# Patient Record
Sex: Female | Born: 1945 | Race: White | Hispanic: No | Smoking: Former smoker
Health system: Southern US, Community
[De-identification: ages and names within clinical notes are randomized; demographics above are authoritative.]

## PROBLEM LIST (undated history)

## (undated) DIAGNOSIS — K5730 Diverticulosis of large intestine without perforation or abscess without bleeding: Secondary | ICD-10-CM

## (undated) DIAGNOSIS — Z780 Asymptomatic menopausal state: Secondary | ICD-10-CM

## (undated) DIAGNOSIS — M545 Low back pain, unspecified: Secondary | ICD-10-CM

## (undated) DIAGNOSIS — E785 Hyperlipidemia, unspecified: Secondary | ICD-10-CM

## (undated) DIAGNOSIS — E039 Hypothyroidism, unspecified: Secondary | ICD-10-CM

## (undated) DIAGNOSIS — F17200 Nicotine dependence, unspecified, uncomplicated: Secondary | ICD-10-CM

## (undated) DIAGNOSIS — K621 Rectal polyp: Secondary | ICD-10-CM

## (undated) DIAGNOSIS — R4701 Aphasia: Secondary | ICD-10-CM

## (undated) DIAGNOSIS — G309 Alzheimer's disease, unspecified: Secondary | ICD-10-CM

## (undated) DIAGNOSIS — M1990 Unspecified osteoarthritis, unspecified site: Secondary | ICD-10-CM

## (undated) DIAGNOSIS — K648 Other hemorrhoids: Secondary | ICD-10-CM

## (undated) DIAGNOSIS — F0280 Dementia in other diseases classified elsewhere without behavioral disturbance: Secondary | ICD-10-CM

## (undated) DIAGNOSIS — G8929 Other chronic pain: Secondary | ICD-10-CM

## (undated) HISTORY — PX: TONSILLECTOMY AND ADENOIDECTOMY: SUR1326

## (undated) HISTORY — DX: Nicotine dependence, unspecified, uncomplicated: F17.200

## (undated) HISTORY — PX: APPENDECTOMY: SHX54

## (undated) HISTORY — DX: Diverticulosis of large intestine without perforation or abscess without bleeding: K57.30

## (undated) HISTORY — DX: Rectal polyp: K62.1

## (undated) HISTORY — DX: Hypothyroidism, unspecified: E03.9

## (undated) HISTORY — DX: Hyperlipidemia, unspecified: E78.5

## (undated) HISTORY — DX: Asymptomatic menopausal state: Z78.0

## (undated) HISTORY — DX: Other hemorrhoids: K64.8

---

## 1989-07-09 HISTORY — PX: MOUTH SURGERY: SHX715

## 1998-10-21 ENCOUNTER — Other Ambulatory Visit: Admission: RE | Admit: 1998-10-21 | Discharge: 1998-10-21 | Payer: Self-pay | Admitting: Obstetrics and Gynecology

## 1998-11-08 DIAGNOSIS — Z780 Asymptomatic menopausal state: Secondary | ICD-10-CM

## 1998-11-08 HISTORY — DX: Asymptomatic menopausal state: Z78.0

## 1999-10-22 ENCOUNTER — Other Ambulatory Visit: Admission: RE | Admit: 1999-10-22 | Discharge: 1999-10-22 | Payer: Self-pay | Admitting: Obstetrics and Gynecology

## 2000-05-17 ENCOUNTER — Encounter: Payer: Self-pay | Admitting: Family Medicine

## 2000-05-17 ENCOUNTER — Encounter: Admission: RE | Admit: 2000-05-17 | Discharge: 2000-05-17 | Payer: Self-pay | Admitting: Family Medicine

## 2000-12-14 ENCOUNTER — Encounter (INDEPENDENT_AMBULATORY_CARE_PROVIDER_SITE_OTHER): Payer: Self-pay | Admitting: Specialist

## 2000-12-14 ENCOUNTER — Other Ambulatory Visit: Admission: RE | Admit: 2000-12-14 | Discharge: 2000-12-14 | Payer: Self-pay | Admitting: Radiology

## 2000-12-15 ENCOUNTER — Other Ambulatory Visit: Admission: RE | Admit: 2000-12-15 | Discharge: 2000-12-15 | Payer: Self-pay | Admitting: Obstetrics and Gynecology

## 2001-03-03 ENCOUNTER — Encounter (INDEPENDENT_AMBULATORY_CARE_PROVIDER_SITE_OTHER): Payer: Self-pay

## 2001-03-03 ENCOUNTER — Other Ambulatory Visit: Admission: RE | Admit: 2001-03-03 | Discharge: 2001-03-03 | Payer: Self-pay | Admitting: Obstetrics and Gynecology

## 2001-05-08 ENCOUNTER — Encounter: Payer: Self-pay | Admitting: Family Medicine

## 2001-05-08 ENCOUNTER — Encounter: Admission: RE | Admit: 2001-05-08 | Discharge: 2001-05-08 | Payer: Self-pay | Admitting: Family Medicine

## 2001-12-18 ENCOUNTER — Other Ambulatory Visit: Admission: RE | Admit: 2001-12-18 | Discharge: 2001-12-18 | Payer: Self-pay | Admitting: Obstetrics and Gynecology

## 2005-04-06 ENCOUNTER — Ambulatory Visit (HOSPITAL_COMMUNITY): Admission: RE | Admit: 2005-04-06 | Discharge: 2005-04-06 | Payer: Self-pay | Admitting: Family Medicine

## 2005-04-08 DIAGNOSIS — K621 Rectal polyp: Secondary | ICD-10-CM

## 2005-04-08 HISTORY — DX: Rectal polyp: K62.1

## 2005-05-14 ENCOUNTER — Encounter (INDEPENDENT_AMBULATORY_CARE_PROVIDER_SITE_OTHER): Payer: Self-pay | Admitting: *Deleted

## 2005-05-14 ENCOUNTER — Ambulatory Visit (HOSPITAL_COMMUNITY): Admission: RE | Admit: 2005-05-14 | Discharge: 2005-05-14 | Payer: Self-pay | Admitting: Gastroenterology

## 2007-02-07 LAB — HM PAP SMEAR: HM Pap smear: NORMAL

## 2007-02-07 LAB — HM MAMMOGRAPHY: HM Mammogram: NORMAL

## 2007-07-09 ENCOUNTER — Ambulatory Visit (HOSPITAL_COMMUNITY): Admission: RE | Admit: 2007-07-09 | Discharge: 2007-07-09 | Payer: Self-pay | Admitting: Family Medicine

## 2010-11-28 ENCOUNTER — Encounter: Payer: Self-pay | Admitting: Family Medicine

## 2011-03-26 NOTE — Op Note (Signed)
NAMEMAKENIZE, MESSMAN             ACCOUNT NO.:  [id number]   MEDICAL RECORD NO.:  [id number]          PATIENT TYPE:  AMB   LOCATION:  ENDO                         FACILITY:  [facility]   PHYSICIAN:  Anselmo Rod, M.D.  DATE OF BIRTH:  June 08, 1946   DATE OF PROCEDURE:  05/14/2005  DATE OF DISCHARGE:                                 OPERATIVE REPORT   PROCEDURE PERFORMED:  Colonoscopy, with cold biopsies x 4.   ENDOSCOPIST:  Anselmo Rod, M.D.   INSTRUMENT USED:  Olympus video colonoscope.   INDICATIONS FOR PROCEDURE:  A 65 year old white female with a history of  acute diverticulitis in the recent past and rectal bleeding on one occasion  in the month of May 2006, undergoing a screening colonoscopy to rule out  colonic polyps, masses, etc.   PRE-PROCEDURE PREPARATION:  Informed consent was procured from the patient.  The patient was fasted for eight hours prior to the procedure and prepped  with a bottle of magnesium citrate and a gallon of GoLYTELY the night prior  to the procedure.  The risks and benefits of the procedure including a 10%  miss rate of cancer and polyps were discussed with the patient as well.   PRE-PROCEDURE PHYSICAL:  VITAL SIGNS:  The patient had stable vital signs.  NECK:  Supple.  CHEST:  Clear to auscultation.  CARDIOVASCULAR:  S1, S2 regular.  ABDOMEN:  Soft, with normal bowel sounds.   DESCRIPTION OF THE PROCEDURE:  The patient was placed in the left lateral  decubitus position, sedated with 100 mg of Demerol and 10 mg of Versed in  slow incremental doses.  Once the patient was adequately sedated and  maintained on low-flow oxygen and continuous cardiac monitoring, the Olympus  video colonoscope was advanced from the rectum to the mid-transverse colon  with difficulty in spite of changing the patient's position from the left  lateral to supine and then right lateral position.  With gentle application  of abdominal pressure, I could not reach the  cecum, and therefore the adult  scope was withdrawn and the pediatric adjustable scope used instead.  This  was carefully advanced toward the cecum with gentle application of abdominal  pressure and the patient in the supine position.  Sigmoid diverticulosis was  noted, and a small rectal polyp was biopsied from the rectum.  The terminal  ileum appeared healthy.  The appendiceal orifice and the ileocecal valve  were clearly visualized and photographed.   IMPRESSION:  1.  Small rectal polyp, biopsied (cold biopsies x 4).  2.  Sigmoid diverticulosis.  3.  Small internal hemorrhoids.  4.  Tortuous colon.  Adult scope changed to pediatric adjustable scope.   RECOMMENDATIONS:  1.  Await pathology results.  2.  Avoid nonsteroidals for the next two weeks.  3.  Repeat colonoscopy depending on pathology results.  4.  Brochures on diverticulosis have been given to the patient for her      education.  5.  Outpatient followup as need arises in the future.       JNM/MEDQ  D:  05/14/2005  T:  05/14/2005  Job:  [id number]   cc:   Nilda Simmer, M.D.  [address]  [place] [address]  Fax: [phone]   Talmadge Coventry, M.D.  [address]  [place]  [place] [address]  Fax: [phone]

## 2011-08-20 LAB — CREATININE, SERUM
Creatinine, Ser: 0.78
GFR calc Af Amer: >60

## 2011-12-20 ENCOUNTER — Telehealth: Payer: Self-pay

## 2011-12-20 NOTE — Telephone Encounter (Signed)
Pt is completely out of levothyroxine 100 mg. Please call if we can refill. Pt plans to see Dr. Alwyn Ren 2/20. [facility]  [facility] on [address]

## 2011-12-21 MED ORDER — LEVOTHYROXINE SODIUM 100 MCG PO TABS: 100.0000 ug | ORAL_TABLET | Freq: Every day | ORAL | Status: DC

## 2011-12-21 NOTE — Telephone Encounter (Signed)
Done Roston Grunewald 

## 2011-12-21 NOTE — Telephone Encounter (Signed)
Chart pulled to clinical TL station 

## 2011-12-21 NOTE — Telephone Encounter (Signed)
Patient's husband notified  

## 2011-12-29 ENCOUNTER — Encounter: Payer: Self-pay | Admitting: *Deleted

## 2011-12-29 DIAGNOSIS — K5730 Diverticulosis of large intestine without perforation or abscess without bleeding: Secondary | ICD-10-CM | POA: Insufficient documentation

## 2011-12-29 DIAGNOSIS — M810 Age-related osteoporosis without current pathological fracture: Secondary | ICD-10-CM | POA: Insufficient documentation

## 2011-12-29 DIAGNOSIS — J309 Allergic rhinitis, unspecified: Secondary | ICD-10-CM | POA: Insufficient documentation

## 2011-12-29 DIAGNOSIS — E039 Hypothyroidism, unspecified: Secondary | ICD-10-CM | POA: Insufficient documentation

## 2011-12-29 DIAGNOSIS — E785 Hyperlipidemia, unspecified: Secondary | ICD-10-CM | POA: Insufficient documentation

## 2011-12-29 DIAGNOSIS — F0390 Unspecified dementia without behavioral disturbance: Secondary | ICD-10-CM

## 2011-12-30 ENCOUNTER — Ambulatory Visit (INDEPENDENT_AMBULATORY_CARE_PROVIDER_SITE_OTHER): Payer: Medicare Other | Admitting: Family Medicine

## 2011-12-30 DIAGNOSIS — M2550 Pain in unspecified joint: Secondary | ICD-10-CM

## 2011-12-30 DIAGNOSIS — F0390 Unspecified dementia without behavioral disturbance: Secondary | ICD-10-CM

## 2011-12-30 DIAGNOSIS — G309 Alzheimer's disease, unspecified: Secondary | ICD-10-CM

## 2011-12-30 DIAGNOSIS — Z79899 Other long term (current) drug therapy: Secondary | ICD-10-CM

## 2011-12-30 DIAGNOSIS — E785 Hyperlipidemia, unspecified: Secondary | ICD-10-CM

## 2011-12-30 DIAGNOSIS — E039 Hypothyroidism, unspecified: Secondary | ICD-10-CM

## 2011-12-30 MED ORDER — ROSUVASTATIN CALCIUM 20 MG PO TABS: 20.0000 mg | ORAL_TABLET | Freq: Every day | ORAL | Status: DC

## 2011-12-30 NOTE — Patient Instructions (Signed)
Dementia Dementia is a general term for problems with brain function. A person with dementia has memory loss and a hard time with at least one other brain function such as thinking, speaking, or problem solving. Dementia can affect social functioning, how you do your job, your mood, or your personality. The changes may be hidden for a long time. The earliest forms of this disease are usually not detected by family or friends. Dementia can be:  Irreversible.   Potentially reversible.   Partially reversible.   Progressive. This means it can get worse over time.  CAUSES  Irreversible dementia causes may include:  Degeneration of brain cells (Alzheimer's disease or lewy body dementia).   Multiple small strokes (vascular dementia).   Infection (chronic meningitis or Creutzfelt-Jakob disease).   Frontotemporal dementia. This affects younger people, age 40 to 70, compared to those who have Alzheimer's disease.   Dementia associated with other disorders like Parkinson's disease, Huntington's disease, or HIV-associated dementia.  Potentially or partially reversible dementia causes may include:  Medicines.   Metabolic causes such as excessive alcohol intake, vitamin B12 deficiency, or thyroid disease.   Masses or pressure in the brain such as a tumor, blood clot, or hydrocephalus.  SYMPTOMS  Symptoms are often hard to detect. Family members or coworkers may not notice them early in the disease process. Different people with dementia may have different symptoms. Symptoms can include:  A hard time with memory, especially recent memory. Long-term memory may not be impaired.   Asking the same question multiple times or forgetting something someone just said.   A hard time speaking your thoughts or finding certain words.   A hard time solving problems or performing familiar tasks (such as how to use a telephone).   Sudden changes in mood.   Changes in personality, especially increasing  moodiness or mistrust.   Depression.   A hard time understanding complex ideas that were never a problem in the past.  DIAGNOSIS  There are no specific tests for dementia.   Your caregiver may recommend a thorough evaluation. This is because some forms of dementia can be reversible. The evaluation will likely include a physical exam and getting a detailed history from you and a family member. The history often gives the best clues and suggestions for a diagnosis.   Memory testing may be done. A detailed brain function evaluation called neuropsychologic testing may be helpful.   Lab tests and brain imaging (such as a CT scan or MRI scan) are sometimes important.   Sometimes observation and re-evaluation over time is very helpful.  TREATMENT  Treatment depends on the cause.   If the problem is a vitamin deficiency, it may be helped or cured with supplements.   For dementias such as Alzheimer's disease, medicines are available to stabilize or slow the course of the disease. There are no cures for this type of dementia.   Your caregiver can help direct you to groups, organizations, and other caregivers to help with decisions in the care of you or your loved one.  HOME CARE INSTRUCTIONS The care of individuals with dementia is varied and dependent upon the progression of the dementia. The following suggestions are intended for the person living with, or caring for, the person with dementia.  Create a safe environment.   Remove the locks on bathroom doors to prevent the person from accidentally locking himself or herself in.   Use childproof latches on kitchen cabinets and any place where cleaning supplies, chemicals,   or alcohol are kept.   Use childproof covers in unused electrical outlets.   Install childproof devices to keep doors and windows secured.   Remove stove knobs or install safety knobs and an automatic shut-off on the stove.   Lower the temperature on water heaters.    Label medicines and keep them locked up.   Secure knives, lighters, matches, power tools, and guns, and keep these items out of reach.   Keep the house free from clutter. Remove rugs or anything that might contribute to a fall.   Remove objects that might break and hurt the person.   Make sure lighting is good, both inside and outside.   Install grab rails as needed.   Use a monitoring device to alert you to falls or other needs for help.   Reduce confusion.   Keep familiar objects and people around.   Use night lights or dim lights at night.   Label items or areas.   Use reminders, notes, or directions for daily activities or tasks.   Keep a simple, consistent routine for waking, meals, bathing, dressing, and bedtime.   Create a calm, quiet environment.   Place large clocks and calendars prominently.   Display emergency numbers and home address near all telephones.   Use cues to establish different times of the day. An example is to open curtains to let the natural light in during the day.    Use effective communication.   Choose simple words and short sentences.   Use a gentle, calm tone of voice.   Be careful not to interrupt.   If the person is struggling to find a word or communicate a thought, try to provide the word or thought.   Ask one question at a time. Allow the person ample time to answer questions. Repeat the question again if the person does not respond.   Reduce nighttime restlessness.   Provide a comfortable bed.   Have a consistent nighttime routine.   Ensure a regular walking or physical activity schedule. Involve the person in daily activities as much as possible.   Limit napping during the day.   Limit caffeine.   Attend social events that stimulate rather than overwhelm the senses.   Encourage good nutrition and hydration.   Reduce distractions during meal times and snacks.   Avoid foods that are too hot or too cold.    Monitor chewing and swallowing ability.   Continue with routine vision, hearing, dental, and medical screenings.   Only give over-the-counter or prescription medicines as directed by the caregiver.   Monitor driving abilities. Do not allow the person to drive when safe driving is no longer possible.   Register with an identification program which could provide location assistance in the event of a missing person situation.  SEEK MEDICAL CARE IF:   New behavioral problems start such as moodiness, aggressiveness, or seeing things that are not there (hallucinations).   Any new problem with brain function happens. This includes problems with balance, speech, or falling a lot.   Problems with swallowing develop.   Any symptoms of other illness happen.  Small changes or worsening in any aspect of brain function can be a sign that the illness is getting worse. It can also be a sign of another medical illness such as infection. Seeing a caregiver right away is important. SEEK IMMEDIATE MEDICAL CARE IF:   A fever develops.   New or worsened confusion develops.   New or worsened   sleepiness develops.   Staying awake becomes hard to do.  Document Released: 04/20/2001 Document Revised: 07/07/2011 Document Reviewed: 03/22/2011 [employer] Patient Information 2012 [employer]. 

## 2011-12-31 LAB — CBC
Hemoglobin: 13.1 g/dL (ref 12.0–15.0)
MCHC: 31.6 g/dL (ref 30.0–36.0)
Platelets: 193 10*3/uL (ref 150–400)
RBC: 4.57 MIL/uL (ref 3.87–5.11)

## 2011-12-31 LAB — COMPREHENSIVE METABOLIC PANEL
ALT: 12 U/L (ref 0–35)
Albumin: 4.1 g/dL (ref 3.5–5.2)
CO2: 24 mEq/L (ref 19–32)
Chloride: 108 mEq/L (ref 96–112)
Glucose, Bld: 84 mg/dL (ref 70–99)
Potassium: 3.7 mEq/L (ref 3.5–5.3)
Sodium: 141 mEq/L (ref 135–145)
Total Bilirubin: 0.5 mg/dL (ref 0.3–1.2)
Total Protein: 6.1 g/dL (ref 6.0–8.3)

## 2011-12-31 LAB — TSH: TSH: 1.734 u[IU]/mL (ref 0.350–4.500)

## 2011-12-31 LAB — LIPID PANEL
Cholesterol: 263 mg/dL — ABNORMAL HIGH (ref 0–200)
VLDL: 19 mg/dL (ref 0–40)

## 2011-12-31 LAB — VITAMIN B12: Vitamin B-12: 269 pg/mL (ref 211–911)

## 2012-01-04 ENCOUNTER — Encounter: Payer: Self-pay | Admitting: Family Medicine

## 2012-01-04 NOTE — Progress Notes (Signed)
Quick Note:  Please call pt and advise that the following labs are abnormal... Lipids are still elevated , even on Crestor. Continue current medication and try to modify diet to reduce fat/cholesterol intake.  I do not advise increasing Crestor dose. Lipid profile can be rechecked at next visit.  All other labs are normal.   Provide a copy of labs to pt's son, Jasmon Mattice. ______

## 2012-01-04 NOTE — Progress Notes (Signed)
  Subjective:    Patient ID: Erika Warren, female    DOB: 08/05/1946, 66 y.o.   MRN: [id number]  HPI   This pleasant 66 y.o. [place] woman is here with her son, Lauriann Milillo, and her in-home   caregiver/assistant ; she needs assistance with all ADLs because  of Dementia. She has been   evaluated and under the care of Dr. Clide Deutscher, a neurologist at [facility],  since 2011. The pt did obtain long-term care insurance but is not in need of a long-term care facility  at this time. Her son wants to review medications and have appropriate labs drawn today.     Review of Systems  Psychiatric/Behavioral: Positive for confusion and sleep disturbance. Negative for agitation. The patient is not nervous/anxious.   All other systems reviewed and are negative.       Objective:   Physical Exam  Constitutional: She appears well-developed and well-nourished. No distress.  HENT:  Head: Normocephalic and atraumatic.  Right Ear: External ear normal.  Left Ear: External ear normal.  Eyes: Conjunctivae and EOM are normal. No scleral icterus.  Neck: Normal range of motion. No thyromegaly present.  Cardiovascular: Normal rate, regular rhythm and normal heart sounds.  Exam reveals no gallop.   No murmur heard. Pulmonary/Chest: Effort normal and breath sounds normal. No respiratory distress.  Abdominal: Soft. Bowel sounds are normal. She exhibits no distension and no mass. There is no tenderness.  Musculoskeletal: Normal range of motion. She exhibits no edema and no tenderness.  Lymphadenopathy:    She has no cervical adenopathy.  Neurological: She is alert. No cranial nerve deficit. She exhibits normal muscle tone. Coordination normal.       Pt not oriented to place or time  Skin: Skin is warm and dry.  Psychiatric: She has a normal mood and affect.       Pt not able to engage in normal conversation with this provider though she is pleasant and cooperative            Assessment & Plan:   1. Hypothyroidism  TSH; continue current medication dose pending results  2. Alzheimer's dementia - stable but disease slowly progressing Comprehensive metabolic panel, CBC, Vitamin B12  3. Hyperlipemia  Lipid panel  4. Medication management  RFs per son's request  5. Arthralgia

## 2012-01-11 ENCOUNTER — Other Ambulatory Visit: Payer: Self-pay | Admitting: Family Medicine

## 2012-01-12 ENCOUNTER — Other Ambulatory Visit: Payer: Self-pay | Admitting: Family Medicine

## 2012-01-25 ENCOUNTER — Encounter (HOSPITAL_BASED_OUTPATIENT_CLINIC_OR_DEPARTMENT_OTHER): Payer: Self-pay | Admitting: *Deleted

## 2012-01-25 ENCOUNTER — Emergency Department (INDEPENDENT_AMBULATORY_CARE_PROVIDER_SITE_OTHER): Payer: Medicare Other

## 2012-01-25 ENCOUNTER — Emergency Department (HOSPITAL_BASED_OUTPATIENT_CLINIC_OR_DEPARTMENT_OTHER)
Admission: EM | Admit: 2012-01-25 | Discharge: 2012-01-25 | Disposition: A | Discharge status: Home Health | Payer: Medicare Other | Attending: Emergency Medicine | Admitting: Emergency Medicine

## 2012-01-25 DIAGNOSIS — Z7982 Long term (current) use of aspirin: Secondary | ICD-10-CM | POA: Insufficient documentation

## 2012-01-25 DIAGNOSIS — M549 Dorsalgia, unspecified: Secondary | ICD-10-CM

## 2012-01-25 DIAGNOSIS — M810 Age-related osteoporosis without current pathological fracture: Secondary | ICD-10-CM | POA: Insufficient documentation

## 2012-01-25 DIAGNOSIS — M545 Low back pain, unspecified: Secondary | ICD-10-CM | POA: Insufficient documentation

## 2012-01-25 DIAGNOSIS — R109 Unspecified abdominal pain: Secondary | ICD-10-CM | POA: Insufficient documentation

## 2012-01-25 DIAGNOSIS — I700 Atherosclerosis of aorta: Secondary | ICD-10-CM

## 2012-01-25 DIAGNOSIS — E039 Hypothyroidism, unspecified: Secondary | ICD-10-CM | POA: Insufficient documentation

## 2012-01-25 DIAGNOSIS — N390 Urinary tract infection, site not specified: Secondary | ICD-10-CM

## 2012-01-25 DIAGNOSIS — Z79899 Other long term (current) drug therapy: Secondary | ICD-10-CM | POA: Insufficient documentation

## 2012-01-25 DIAGNOSIS — F29 Unspecified psychosis not due to a substance or known physiological condition: Secondary | ICD-10-CM | POA: Insufficient documentation

## 2012-01-25 DIAGNOSIS — F0390 Unspecified dementia without behavioral disturbance: Secondary | ICD-10-CM | POA: Insufficient documentation

## 2012-01-25 LAB — URINALYSIS, ROUTINE W REFLEX MICROSCOPIC
Ketones, ur: NEGATIVE mg/dL
Leukocytes, UA: NEGATIVE
Nitrite: NEGATIVE
Specific Gravity, Urine: 1.014 (ref 1.005–1.030)
Urobilinogen, UA: 0.2 mg/dL (ref 0.0–1.0)
pH: 7 (ref 5.0–8.0)

## 2012-01-25 MED ORDER — HYDROCODONE-ACETAMINOPHEN 5-500 MG PO TABS: 1.0000 | ORAL_TABLET | Freq: Four times a day (QID) | ORAL | Status: AC | PRN

## 2012-01-25 NOTE — ED Notes (Signed)
Patient is resting comfortably. 

## 2012-01-25 NOTE — ED Notes (Signed)
Secondary Assessment-  Per family pt has been c/o back pain for several days as well as left leg pain.  Pt has a baseline dx of dementia and unable to provide a clear history.  Family denies injury.

## 2012-01-25 NOTE — Discharge Instructions (Signed)
Back Pain, Adult Low back pain is very common. About 1 in 5 people have back pain.The cause of low back pain is rarely dangerous. The pain often gets better over time.About half of people with a sudden onset of back pain feel better in just 2 weeks. About 8 in 10 people feel better by 6 weeks.  CAUSES Some common causes of back pain include:  Strain of the muscles or ligaments supporting the spine.   Wear and tear (degeneration) of the spinal discs.   Arthritis.   Direct injury to the back.  DIAGNOSIS Most of the time, the direct cause of low back pain is not known.However, back pain can be treated effectively even when the exact cause of the pain is unknown.Answering your caregiver's questions about your overall health and symptoms is one of the most accurate ways to make sure the cause of your pain is not dangerous. If your caregiver needs more information, he or she may order lab work or imaging tests (X-rays or MRIs).However, even if imaging tests show changes in your back, this usually does not require surgery. HOME CARE INSTRUCTIONS For many people, back pain returns.Since low back pain is rarely dangerous, it is often a condition that people can learn to manageon their own.   Remain active. It is stressful on the back to sit or stand in one place. Do not sit, drive, or stand in one place for more than 30 minutes at a time. Take short walks on level surfaces as soon as pain allows.Try to increase the length of time you walk each day.   Do not stay in bed.Resting more than 1 or 2 days can delay your recovery.   Do not avoid exercise or work.Your body is made to move.It is not dangerous to be active, even though your back may hurt.Your back will likely heal faster if you return to being active before your pain is gone.   Pay attention to your body when you bend and lift. Many people have less discomfortwhen lifting if they bend their knees, keep the load close to their  bodies,and avoid twisting. Often, the most comfortable positions are those that put less stress on your recovering back.   Find a comfortable position to sleep. Use a firm mattress and lie on your side with your knees slightly bent. If you lie on your back, put a pillow under your knees.   Only take over-the-counter or prescription medicines as directed by your caregiver. Over-the-counter medicines to reduce pain and inflammation are often the most helpful.Your caregiver may prescribe muscle relaxant drugs.These medicines help dull your pain so you can more quickly return to your normal activities and healthy exercise.   Put ice on the injured area.   Put ice in a plastic bag.   Place a towel between your skin and the bag.   Leave the ice on for 15 to 20 minutes, 3 to 4 times a day for the first 2 to 3 days. After that, ice and heat may be alternated to reduce pain and spasms.   Ask your caregiver about trying back exercises and gentle massage. This may be of some benefit.   Avoid feeling anxious or stressed.Stress increases muscle tension and can worsen back pain.It is important to recognize when you are anxious or stressed and learn ways to manage it.Exercise is a great option.  SEEK MEDICAL CARE IF:  You have pain that is not relieved with rest or medicine.   You have   pain that does not improve in 1 week.   You have new symptoms.   You are generally not feeling well.  SEEK IMMEDIATE MEDICAL CARE IF:   You have pain that radiates from your back into your legs.   You develop new bowel or bladder control problems.   You have unusual weakness or numbness in your arms or legs.   You develop nausea or vomiting.   You develop abdominal pain.   You feel faint.  Document Released: 10/25/2005 Document Revised: 10/14/2011 Document Reviewed: 03/15/2011 [employer] Patient Information 2012 [employer]. 

## 2012-01-25 NOTE — ED Notes (Signed)
Ambulatory to [facility] for urine specimen.

## 2012-01-25 NOTE — ED Notes (Signed)
3 day history of uti symptoms and low back pain and upper leg pain states she isnt hurting now she is here with her son

## 2012-01-25 NOTE — ED Notes (Signed)
Warm Blankets given  

## 2012-01-25 NOTE — ED Notes (Signed)
Pt returned from radiology.  No change in condition. 

## 2012-01-25 NOTE — ED Provider Notes (Signed)
History     CSN: [id number]  Arrival date & time 01/25/12  0825   First MD Initiated Contact with Patient 01/25/12 0912      Chief Complaint  Patient presents with  . Back Pain  . Urinary Tract Infection    (Consider location/radiation/quality/duration/timing/severity/associated sxs/prior treatment) HPI Comments: Patient has a history of dementia, so the history is somewhat limited, but per her son, who she lives with him per the patient, she has some discomfort to her lower back area. This has been going on for the last several days. She's also been complaining intermittently of some discomfort in her lower abdomen. She denies any burning on urination. Denies a history of UTIs in the past. Denies any nausea, vomiting, or fevers. She had complained previously of some pain in her upper legs, but her son was feeling that this was more from the way that she was sitting in a chair.  There is no history of recent falls  Patient is a 66 y.o. female presenting with back pain and urinary tract infection. The history is provided by the patient.  Back Pain  This is a new problem. Episode onset: several days ago. The problem occurs constantly. The problem has not changed since onset.The pain is associated with no known injury. The pain is present in the lumbar spine. The pain does not radiate. The pain is mild. Associated symptoms include abdominal pain. Pertinent negatives include no chest pain, no fever, no numbness, no headaches and no weakness.  Urinary Tract Infection Associated symptoms include abdominal pain. Pertinent negatives include no chest pain, no headaches and no shortness of breath.    Past Medical History  Diagnosis Date  . Hypothyroid   . Allergic rhinitis   . Osteoporosis   . Menopause 2000  . Other and unspecified hyperlipidemia   . Tobacco use disorder   . Rectal polyp 04/2005  . Sigmoid diverticulosis   . Internal hemorrhoids   . Dementia     Past Surgical History    Procedure Date  . Tonsillectomy and adenoidectomy   . Appendectomy     History reviewed. No pertinent family history.  History  Substance Use Topics  . Smoking status: Former Smoker    Quit date: 12/10/2007  . Smokeless tobacco: Never Used  . Alcohol Use: Yes     occ    OB History    Grav Para Term Preterm Abortions TAB SAB Ect Mult Living                  Review of Systems  Constitutional: Negative for fever, chills, diaphoresis and fatigue.  HENT: Negative for congestion, rhinorrhea and sneezing.   Eyes: Negative.   Respiratory: Negative for cough, chest tightness and shortness of breath.   Cardiovascular: Negative for chest pain and leg swelling.  Gastrointestinal: Positive for abdominal pain. Negative for nausea, vomiting, diarrhea and blood in stool.  Genitourinary: Negative for frequency, hematuria, flank pain and difficulty urinating.  Musculoskeletal: Positive for back pain. Negative for arthralgias.  Skin: Negative for rash.  Neurological: Negative for dizziness, speech difficulty, weakness, numbness and headaches.    Allergies  Sulfa antibiotics  Home Medications   Current Outpatient Rx  Name Route Sig Dispense Refill  . ACETAMINOPHEN 500 MG PO TABS Oral Take 500 mg by mouth every 6 (six) hours as needed.    . ASPIRIN 81 MG PO TABS Oral Take 81 mg by mouth daily.    Marland Kitchen VITAMIN D 1000 UNITS PO TABS  Oral Take 5,000 Units by mouth daily.     . DONEPEZIL HCL 5 MG PO TABS Oral Take 10 mg by mouth at bedtime as needed.     Marland Kitchen HYDROCODONE-ACETAMINOPHEN 5-500 MG PO TABS Oral Take 1-2 tablets by mouth every 6 (six) hours as needed for pain. 15 tablet 0  . LEVOTHYROXINE SODIUM 100 MCG PO TABS  TAKE 1 TABLET BY MOUTH EVERY DAY **NEEDS OV** 30 tablet 2  . MEMANTINE HCL 10 MG PO TABS Oral Take 10 mg by mouth 2 (two) times daily.    Marland Kitchen ONE-DAILY MULTI VITAMINS PO TABS Oral Take 1 tablet by mouth daily.    Marland Kitchen ROSUVASTATIN CALCIUM 20 MG PO TABS Oral Take 1 tablet (20 mg  total) by mouth daily. 30 tablet 5    BP 136/90  Pulse 78  Temp(Src) 98.1 F (36.7 C) (Oral)  Resp 20  SpO2 100%  Physical Exam  Constitutional: She appears well-developed and well-nourished.  HENT:  Head: Normocephalic and atraumatic.  Eyes: Pupils are equal, round, and reactive to light.  Neck: Normal range of motion. Neck supple.  Cardiovascular: Normal rate, regular rhythm and normal heart sounds.   Pulmonary/Chest: Effort normal and breath sounds normal. No respiratory distress. She has no wheezes. She has no rales. She exhibits no tenderness.  Abdominal: Soft. Bowel sounds are normal. There is no tenderness. There is no rebound and no guarding.  Musculoskeletal: Normal range of motion. She exhibits no edema.       Mild tenderness of the lower lumbar spine. No step-offs or deformities are noted. Negative straight leg raise bilaterally. She has normal sensation and motor function in the legs bilaterally. No edema to the legs are noted. No pain on range of motion of the lower extremities or hips are noted  Lymphadenopathy:    She has no cervical adenopathy.  Neurological: She is alert.       There is anterior some questions appropriately, but has some confusion and a hard time answering questions, which is consistent with her dementia and is at baseline per family  Skin: Skin is warm and dry. No rash noted.  Psychiatric: She has a normal mood and affect.    ED Course  Procedures (including critical care time)  Results for orders placed during the hospital encounter of 01/25/12  URINALYSIS, ROUTINE W REFLEX MICROSCOPIC      Component Value Range   Color, Urine YELLOW  YELLOW    APPearance CLEAR  CLEAR    Specific Gravity, Urine 1.014  1.005 - 1.030    pH 7.0  5.0 - 8.0    Glucose, UA NEGATIVE  NEGATIVE (mg/dL)   Hgb urine dipstick NEGATIVE  NEGATIVE    Bilirubin Urine NEGATIVE  NEGATIVE    Ketones, ur NEGATIVE  NEGATIVE (mg/dL)   Protein, ur NEGATIVE  NEGATIVE (mg/dL)    Urobilinogen, UA 0.2  0.0 - 1.0 (mg/dL)   Nitrite NEGATIVE  NEGATIVE    Leukocytes, UA NEGATIVE  NEGATIVE    Dg Lumbar Spine Complete  01/25/2012  *RADIOLOGY REPORT*  Clinical Data: Back pain and urinary tract infection. No known injury.  LUMBAR SPINE - COMPLETE 4+ VIEW  Comparison: None.  Findings: There are five lumbar-type vertebral bodies.  Vertebral bodies are normal in height and alignment.  There is moderate disc space narrowing at L3-4, L4-5, and L5-S1.  Posterior osseous spurring is present at L4-5 and L5-S1.  There are facet joint degenerative changes, most prominent at L4-5.   Sacroiliac  joints are unremarkable.  No evidence of pars defect or acute bony abnormality.  Atherosclerotic calcification of the abdominal aorta is noted.  No evidence of aneurysm.  IMPRESSION: 1. Moderate degenerative changes of the lower lumbar spine.  2.  Atherosclerotic calcification of the abdominal aorta.  Original Report Authenticated By: Britta Mccreedy, M.D.     No results found.   Dg Lumbar Spine Complete  01/25/2012  *RADIOLOGY REPORT*  Clinical Data: Back pain and urinary tract infection. No known injury.  LUMBAR SPINE - COMPLETE 4+ VIEW  Comparison: None.  Findings: There are five lumbar-type vertebral bodies.  Vertebral bodies are normal in height and alignment.  There is moderate disc space narrowing at L3-4, L4-5, and L5-S1.  Posterior osseous spurring is present at L4-5 and L5-S1.  There are facet joint degenerative changes, most prominent at L4-5.   Sacroiliac joints are unremarkable.  No evidence of pars defect or acute bony abnormality.  Atherosclerotic calcification of the abdominal aorta is noted.  No evidence of aneurysm.  IMPRESSION: 1. Moderate degenerative changes of the lower lumbar spine.  2.  Atherosclerotic calcification of the abdominal aorta.  Original Report Authenticated By: Britta Mccreedy, M.D.     1. Back pain       MDM  Pt well appearing, no distress. No evidence of  UTI/compression fracture.  Will f/u with her PMD if no better        Rolan Bucco, MD 01/25/12 [phone]

## 2012-01-25 NOTE — ED Notes (Signed)
MD at bedside. 

## 2012-01-25 NOTE — ED Notes (Signed)
Took patient to restroom with urine collection  Hat unable to obtain a urine specimen notified son that we may need to do an in and out cath to obtain a specimen he voices understanding pt did however have a bowel movement while we were in the restroom.

## 2012-01-25 NOTE — ED Notes (Signed)
Family at bedside. 

## 2012-01-25 NOTE — ED Notes (Signed)
Patient transported to X-ray via stretcher 

## 2012-04-04 ENCOUNTER — Other Ambulatory Visit: Payer: Self-pay | Admitting: Physician Assistant

## 2012-04-11 ENCOUNTER — Encounter: Payer: Self-pay | Admitting: Family Medicine

## 2012-04-28 ENCOUNTER — Encounter: Payer: Self-pay | Admitting: Family Medicine

## 2012-04-28 ENCOUNTER — Ambulatory Visit (INDEPENDENT_AMBULATORY_CARE_PROVIDER_SITE_OTHER): Payer: Medicare Other | Admitting: Family Medicine

## 2012-04-28 VITALS — BP 135/80 | HR 74 | Temp 97.7°F | Resp 16 | Ht 63.25 in | Wt 125.0 lb

## 2012-04-28 DIAGNOSIS — E039 Hypothyroidism, unspecified: Secondary | ICD-10-CM

## 2012-04-28 DIAGNOSIS — E7800 Pure hypercholesterolemia, unspecified: Secondary | ICD-10-CM

## 2012-04-28 MED ORDER — LEVOTHYROXINE SODIUM 100 MCG PO TABS: 100.0000 ug | ORAL_TABLET | Freq: Every day | ORAL | Status: DC

## 2012-04-28 MED ORDER — ROSUVASTATIN CALCIUM 20 MG PO TABS: 20.0000 mg | ORAL_TABLET | Freq: Every day | ORAL | Status: DC

## 2012-05-02 ENCOUNTER — Encounter: Payer: Self-pay | Admitting: Family Medicine

## 2012-05-02 NOTE — Progress Notes (Signed)
  Subjective:    Patient ID: Erika Warren, female    DOB: 10-16-1946, 66 y.o.   MRN: [id number]  HPI   Pt is here with her son today for follow-up; she has Dementia, Hypothyroidism and chronic  sleep disturbance. She is cooperative per son and has a good appetite. She walks a lot during the  day but has no hx of wandering. She is compliant with medications and has no side effects. There has been no significant change in her status since last visit. Last labs were checked in Feb 2013; Total chol=263 and LDL= 176. Son [name]) would rather not have labs checked today and agrees to  monitoring labs at next visit.    Review of Systems Noncontributory     Objective:   Physical Exam  Vitals reviewed. Constitutional: She appears well-developed and well-nourished. No distress.  HENT:  Head: Normocephalic and atraumatic.  Eyes: EOM are normal. No scleral icterus.  Cardiovascular: Normal rate.   Pulmonary/Chest: Effort normal. No respiratory distress.  Musculoskeletal: Normal range of motion. She exhibits no edema.  Neurological: She is alert. No cranial nerve deficit.       Oriented to person only  Skin: Skin is warm and dry.  Psychiatric:       Flat affect and expressionless/ blunt facies          Assessment & Plan:   1. Unspecified hypothyroidism - RF: Levothyroxine 100 mcg 1 tab daily   #30 with 5 RFs  2. Pure hypercholesterolemia - RF: Rosuvastain 20 mg  1 tab every PM   #30 with 5 RFs

## 2012-06-08 ENCOUNTER — Ambulatory Visit (INDEPENDENT_AMBULATORY_CARE_PROVIDER_SITE_OTHER): Payer: Medicare Other | Admitting: Family Medicine

## 2012-06-08 ENCOUNTER — Encounter: Payer: Self-pay | Admitting: Family Medicine

## 2012-06-08 VITALS — BP 116/73 | HR 72 | Temp 98.0°F | Resp 18 | Wt 128.0 lb

## 2012-06-08 DIAGNOSIS — Z0289 Encounter for other administrative examinations: Secondary | ICD-10-CM

## 2012-06-08 DIAGNOSIS — Z111 Encounter for screening for respiratory tuberculosis: Secondary | ICD-10-CM

## 2012-06-08 DIAGNOSIS — G309 Alzheimer's disease, unspecified: Secondary | ICD-10-CM

## 2012-06-08 DIAGNOSIS — F0280 Dementia in other diseases classified elsewhere without behavioral disturbance: Secondary | ICD-10-CM

## 2012-06-10 ENCOUNTER — Encounter (INDEPENDENT_AMBULATORY_CARE_PROVIDER_SITE_OTHER): Payer: Medicare Other

## 2012-06-10 DIAGNOSIS — Z111 Encounter for screening for respiratory tuberculosis: Secondary | ICD-10-CM

## 2012-06-10 LAB — TB SKIN TEST: Induration: 0 mm

## 2012-06-12 NOTE — Progress Notes (Signed)
  Subjective:    Patient ID: Erika Warren, female    DOB: Sep 21, 1946, 66 y.o.   MRN: [id number]  HPI  This 66 y.o. Cauc female is here today with her son to have pre-admission exam for long-term  care placement; she needs a PPD placed as well as FL-2 completed. (The pt is not aware of the  arrangements but she has been to visit the facility with her family). Erika Warren, the pt's son comments  on how difficult a decision this has been but at the same time, all caretakers are near a point of  exhaustion. The family cannot continue with in-home care.   Review of Systems  Constitutional: Negative for fever, activity change, appetite change, fatigue and unexpected weight change.  Eyes: Negative.   Respiratory: Negative.   Cardiovascular: Negative.   Gastrointestinal: Negative.   Genitourinary:       Pt experiences bladder and bowel incontinence  Musculoskeletal: Positive for gait problem.  Neurological: Negative.   Psychiatric/Behavioral: Positive for confusion and decreased concentration. Negative for disturbed wake/sleep cycle, self-injury, dysphoric mood and agitation.       Son reports that pt does wander       Objective:   Physical Exam  Nursing note and vitals reviewed. Constitutional: She appears well-developed and well-nourished.  HENT:  Head: Normocephalic and atraumatic.  Right Ear: External ear normal.  Left Ear: External ear normal.  Mouth/Throat: Oropharynx is clear and moist.  Eyes: Conjunctivae and EOM are normal. No scleral icterus.       Wears corrective lenses  Neck: No thyromegaly present.  Cardiovascular: Normal rate, regular rhythm and normal heart sounds.  Exam reveals no gallop and no friction rub.   No murmur heard. Pulmonary/Chest: Effort normal and breath sounds normal. No respiratory distress.  Musculoskeletal: She exhibits no edema and no tenderness.       Major joints exhibit stiffness and decreased ROM  Lymphadenopathy:    She has no cervical  adenopathy.  Neurological: She is alert. No cranial nerve deficit.       Pt not oriented to place or time. She is verbal but not communicative; she ambulates without assistance but with shuffling-type gait. She can sing along with lyrics of songs she is familiar with and even "danced" a little - moved from side-to-side in rhythm with music. She is very cooperative.  Skin: Skin is warm and dry.        Assessment & Plan:   1. Alzheimer's dementia  Pt being placed into long-term care by family  2. Screening examination for pulmonary tuberculosis  TB Skin Test (pt to return in 48-72 hours for reading)  3. Encounter for other specified administrative purpose  FL-2 and other paperwork/ forms to be completed prior to admission to [facility]

## 2012-07-17 ENCOUNTER — Telehealth: Payer: Self-pay

## 2012-07-17 NOTE — Telephone Encounter (Signed)
[facility] called to get a verbal physician order for Home Health OT & PT.  The patient is a patient of Dr. Audria Nine.  The patient's family has requested these home health services and the patient lives at [facility].  Please call [facility] at [phone]-this is a secured line and a verbal order may be left on this line.

## 2012-07-17 NOTE — Telephone Encounter (Signed)
Verbal authorization for requested services given via voice mail.

## 2012-07-18 ENCOUNTER — Telehealth: Payer: Self-pay | Admitting: Radiology

## 2012-07-18 NOTE — Telephone Encounter (Signed)
I call pharmacy [phone]) which is back -up pharmacy for [facility]. I will need to call back before 6 PM tomorrow to discuss this medication issue.

## 2012-07-18 NOTE — Telephone Encounter (Signed)
PT is asking for 6 visits, speech therapist and occupational therapist for patient.  Son, [name] is okay with [facility] so she can communicate and feed herself.   Call [phone]

## 2012-07-18 NOTE — Telephone Encounter (Signed)
Pharmacy calling about dulcolax suppository order, wants to clarify sig, states one daily prn diarrhea please advise. I did not see any documentation about this order [phone] is pharmacy # Rx care.

## 2012-07-21 ENCOUNTER — Emergency Department (HOSPITAL_BASED_OUTPATIENT_CLINIC_OR_DEPARTMENT_OTHER)
Admission: EM | Admit: 2012-07-21 | Discharge: 2012-07-21 | Disposition: A | Discharge status: Home / Self Care | Payer: Medicare Other | Attending: Emergency Medicine | Admitting: Emergency Medicine

## 2012-07-21 ENCOUNTER — Emergency Department (HOSPITAL_BASED_OUTPATIENT_CLINIC_OR_DEPARTMENT_OTHER): Payer: Medicare Other

## 2012-07-21 ENCOUNTER — Encounter (HOSPITAL_BASED_OUTPATIENT_CLINIC_OR_DEPARTMENT_OTHER): Payer: Self-pay | Admitting: Emergency Medicine

## 2012-07-21 DIAGNOSIS — Z7982 Long term (current) use of aspirin: Secondary | ICD-10-CM | POA: Insufficient documentation

## 2012-07-21 DIAGNOSIS — F0390 Unspecified dementia without behavioral disturbance: Secondary | ICD-10-CM | POA: Insufficient documentation

## 2012-07-21 DIAGNOSIS — M810 Age-related osteoporosis without current pathological fracture: Secondary | ICD-10-CM | POA: Insufficient documentation

## 2012-07-21 DIAGNOSIS — M549 Dorsalgia, unspecified: Secondary | ICD-10-CM

## 2012-07-21 DIAGNOSIS — Z87891 Personal history of nicotine dependence: Secondary | ICD-10-CM | POA: Insufficient documentation

## 2012-07-21 DIAGNOSIS — Z882 Allergy status to sulfonamides status: Secondary | ICD-10-CM | POA: Insufficient documentation

## 2012-07-21 LAB — CBC WITH DIFFERENTIAL/PLATELET
Basophils Absolute: 0 10*3/uL (ref 0.0–0.1)
Basophils Relative: 0 % (ref 0–1)
Eosinophils Absolute: 0.1 10*3/uL (ref 0.0–0.7)
Eosinophils Relative: 2 % (ref 0–5)
HCT: 36.4 % (ref 36.0–46.0)
MCHC: 33.5 g/dL (ref 30.0–36.0)
MCV: 91.2 fL (ref 78.0–100.0)
Monocytes Absolute: 0.5 10*3/uL (ref 0.1–1.0)
RDW: 12.1 % (ref 11.5–15.5)

## 2012-07-21 LAB — COMPREHENSIVE METABOLIC PANEL
AST: 37 U/L (ref 0–37)
Albumin: 3.7 g/dL (ref 3.5–5.2)
Calcium: 9.4 mg/dL (ref 8.4–10.5)
Creatinine, Ser: 0.9 mg/dL (ref 0.50–1.10)
GFR calc non Af Amer: 65 mL/min — ABNORMAL LOW (ref >90)
Total Protein: 6.8 g/dL (ref 6.0–8.3)

## 2012-07-21 LAB — URINALYSIS, ROUTINE W REFLEX MICROSCOPIC
Glucose, UA: NEGATIVE mg/dL
Hgb urine dipstick: NEGATIVE
Specific Gravity, Urine: 1.01 (ref 1.005–1.030)
Urobilinogen, UA: 0.2 mg/dL (ref 0.0–1.0)

## 2012-07-21 MED ORDER — SODIUM CHLORIDE 0.9 % IV BOLUS (SEPSIS)
1000.0000 mL | Freq: Once | INTRAVENOUS | Status: AC
  Administered 2012-07-21: 1000 mL via INTRAVENOUS

## 2012-07-21 MED ORDER — TRAMADOL HCL 50 MG PO TABS: 50.0000 mg | ORAL_TABLET | Freq: Four times a day (QID) | ORAL | Status: AC | PRN

## 2012-07-21 MED ORDER — KETOROLAC TROMETHAMINE 30 MG/ML IJ SOLN
30.0000 mg | Freq: Once | INTRAMUSCULAR | Status: AC
  Administered 2012-07-21: 30 mg via INTRAVENOUS
  Filled 2012-07-21: qty 1

## 2012-07-21 NOTE — ED Provider Notes (Signed)
History     CSN: [id number]  Arrival date & time 07/21/12  4098   First MD Initiated Contact with Patient 07/21/12 [phone]      Chief Complaint  Patient presents with  . Leg Pain  . Headache  . Back Pain  . Rash    (Consider location/radiation/quality/duration/timing/severity/associated sxs/prior treatment) HPI This patient was dementia now presents with familial concerns of back pain.  Per report the patient started to complain of back pain approximately 3 days ago.  Since onset symptoms have been intermittent, increasingly more uncomfortable.  The pain is diffuse, lower back, on the left greater than the right.  The pain seems to be crampy, improved with Tylenol.  No reports of new incontinence, though this is a chronic issue. The patient has no reports of fevers, vomiting. History of present illness is provided by the patient's sons and nursing home records. Past Medical History  Diagnosis Date  . Hypothyroid   . Allergic rhinitis   . Osteoporosis   . Menopause 2000  . Other and unspecified hyperlipidemia   . Tobacco use disorder   . Rectal polyp 04/2005  . Sigmoid diverticulosis   . Internal hemorrhoids   . Dementia     Past Surgical History  Procedure Date  . Tonsillectomy and adenoidectomy   . Appendectomy     History reviewed. No pertinent family history.  History  Substance Use Topics  . Smoking status: Former Smoker    Quit date: 12/10/2007  . Smokeless tobacco: Never Used  . Alcohol Use: Yes     occ    OB History    Grav Para Term Preterm Abortions TAB SAB Ect Mult Living                  Review of Systems  Unable to perform ROS: Dementia    Allergies  Sulfa antibiotics  Home Medications   Current Outpatient Rx  Name Route Sig Dispense Refill  . ACETAMINOPHEN 500 MG PO TABS Oral Take 500 mg by mouth every 6 (six) hours as needed.    . ASPIRIN 81 MG PO TABS Oral Take 81 mg by mouth daily.    Marland Kitchen VITAMIN D 1000 UNITS PO TABS Oral Take 5,000  Units by mouth daily.     . DONEPEZIL HCL 5 MG PO TABS Oral Take 10 mg by mouth at bedtime as needed.     Marland Kitchen LEVOTHYROXINE SODIUM 100 MCG PO TABS Oral Take 1 tablet (100 mcg total) by mouth daily. 30 tablet 5  . MEMANTINE HCL 10 MG PO TABS Oral Take 10 mg by mouth 2 (two) times daily.    Marland Kitchen ONE-DAILY MULTI VITAMINS PO TABS Oral Take 1 tablet by mouth daily.    . NON FORMULARY      . ROSUVASTATIN CALCIUM 20 MG PO TABS Oral Take 1 tablet (20 mg total) by mouth daily. 30 tablet 5    BP 140/70  Pulse 80  Temp 98.4 F (36.9 C) (Axillary)  Resp 18  SpO2 99%  Physical Exam  Nursing note and vitals reviewed. Constitutional: She appears well-developed and well-nourished. No distress.  HENT:  Head: Normocephalic and atraumatic.  Eyes: Conjunctivae normal and EOM are normal.  Cardiovascular: Normal rate and regular rhythm.   Pulmonary/Chest: Effort normal and breath sounds normal. No stridor. No respiratory distress.  Abdominal: She exhibits no distension.  Musculoskeletal: She exhibits no edema.       Tenderness to palpation without appreciable deformity in the lower back.  Neurological: She is alert. No cranial nerve deficit.  Skin: Skin is warm and dry.  Psychiatric: She has a normal mood and affect. Cognition and memory are impaired. She exhibits abnormal recent memory and abnormal remote memory.    ED Course  Procedures (including critical care time)   Labs Reviewed  CBC WITH DIFFERENTIAL  COMPREHENSIVE METABOLIC PANEL  URINALYSIS, ROUTINE W REFLEX MICROSCOPIC   No results found.   No diagnosis found.    MDM  This elderly female with dementia, prior history of degenerative joint disease now presents with familial concerns of back pain.  On exam the patient is in no distress with no focal neurovascular deficits, though she is demented and incapable of providing a full history of present illness.  The patient's labs and x-ray are largely reassuring, and given the denial of any  falls, trauma, physical exam findings consistent with neurologic changes, she is appropriate for discharge with continued management as an outpatient.  Discussed all findings with the family.  The patient was discharged in stable condition.  Gerhard Munch, MD 07/21/12 1306

## 2012-07-21 NOTE — ED Notes (Addendum)
Pt brought via EMS. Pt has been complaining of pain in legs that radiates to back. Pt. Family also states she has complain of headaches. Pt family also concerned about a rash on right shoulder.

## 2012-07-21 NOTE — Discharge Instructions (Signed)
Back Pain, Adult Low back pain is very common. About 1 in 5 people have back pain.The cause of low back pain is rarely dangerous. The pain often gets better over time.About half of people with a sudden onset of back pain feel better in just 2 weeks. About 8 in 10 people feel better by 6 weeks.  CAUSES Some common causes of back pain include:  Strain of the muscles or ligaments supporting the spine.   Wear and tear (degeneration) of the spinal discs.   Arthritis.   Direct injury to the back.  DIAGNOSIS Most of the time, the direct cause of low back pain is not known.However, back pain can be treated effectively even when the exact cause of the pain is unknown.Answering your caregiver's questions about your overall health and symptoms is one of the most accurate ways to make sure the cause of your pain is not dangerous. If your caregiver needs more information, he or she may order lab work or imaging tests (X-rays or MRIs).However, even if imaging tests show changes in your back, this usually does not require surgery. HOME CARE INSTRUCTIONS For many people, back pain returns.Since low back pain is rarely dangerous, it is often a condition that people can learn to manageon their own.   Remain active. It is stressful on the back to sit or stand in one place. Do not sit, drive, or stand in one place for more than 30 minutes at a time. Take short walks on level surfaces as soon as pain allows.Try to increase the length of time you walk each day.   Do not stay in bed.Resting more than 1 or 2 days can delay your recovery.   Do not avoid exercise or work.Your body is made to move.It is not dangerous to be active, even though your back may hurt.Your back will likely heal faster if you return to being active before your pain is gone.   Pay attention to your body when you bend and lift. Many people have less discomfortwhen lifting if they bend their knees, keep the load close to their  bodies,and avoid twisting. Often, the most comfortable positions are those that put less stress on your recovering back.   Find a comfortable position to sleep. Use a firm mattress and lie on your side with your knees slightly bent. If you lie on your back, put a pillow under your knees.   Only take over-the-counter or prescription medicines as directed by your caregiver. Over-the-counter medicines to reduce pain and inflammation are often the most helpful.Your caregiver may prescribe muscle relaxant drugs.These medicines help dull your pain so you can more quickly return to your normal activities and healthy exercise.   Put ice on the injured area.   Put ice in a plastic bag.   Place a towel between your skin and the bag.   Leave the ice on for 15 to 20 minutes, 3 to 4 times a day for the first 2 to 3 days. After that, ice and heat may be alternated to reduce pain and spasms.   Ask your caregiver about trying back exercises and gentle massage. This may be of some benefit.   Avoid feeling anxious or stressed.Stress increases muscle tension and can worsen back pain.It is important to recognize when you are anxious or stressed and learn ways to manage it.Exercise is a great option.  SEEK MEDICAL CARE IF:  You have pain that is not relieved with rest or medicine.   You have   pain that does not improve in 1 week.   You have new symptoms.   You are generally not feeling well.  SEEK IMMEDIATE MEDICAL CARE IF:   You have pain that radiates from your back into your legs.   You develop new bowel or bladder control problems.   You have unusual weakness or numbness in your arms or legs.   You develop nausea or vomiting.   You develop abdominal pain.   You feel faint.  Document Released: 10/25/2005 Document Revised: 10/14/2011 Document Reviewed: 03/15/2011 [employer] Patient Information 2012 [employer]. 

## 2012-07-24 NOTE — Telephone Encounter (Signed)
I spoke with Orpah Clinton, the Pharmacist and this medication order was clarified last week (the pt has occasional impaction- this info from pt's son- and she is actually leaking around the impaction so Dulcolax is used to maintain soft stools).

## 2012-08-01 ENCOUNTER — Other Ambulatory Visit: Payer: Self-pay | Admitting: Family Medicine

## 2012-08-03 ENCOUNTER — Other Ambulatory Visit: Payer: Self-pay | Admitting: Family Medicine

## 2012-08-24 ENCOUNTER — Telehealth: Payer: Self-pay

## 2012-08-24 NOTE — Telephone Encounter (Signed)
Spoke with [employer]. Office notes needed for proof of face to face encounter with patient. Office notes sent.

## 2012-08-24 NOTE — Telephone Encounter (Signed)
Katrice From [facility] needs to have the Office Notes Faxed or speak to Dr. Audria Nine regarding the 06/08/12 visit for Wenda Low.  Kameryn is currently residing at [facility].  Katrice states she has previously requested the office notes and is now out of compliance.   [phone] [facility] # [phone] fax #

## 2012-09-28 ENCOUNTER — Ambulatory Visit: Payer: Medicare Other | Admitting: Family Medicine

## 2012-10-18 ENCOUNTER — Encounter (HOSPITAL_BASED_OUTPATIENT_CLINIC_OR_DEPARTMENT_OTHER): Payer: Self-pay | Admitting: Emergency Medicine

## 2012-10-18 ENCOUNTER — Emergency Department (HOSPITAL_BASED_OUTPATIENT_CLINIC_OR_DEPARTMENT_OTHER): Payer: Medicare Other

## 2012-10-18 ENCOUNTER — Emergency Department (HOSPITAL_BASED_OUTPATIENT_CLINIC_OR_DEPARTMENT_OTHER)
Admission: EM | Admit: 2012-10-18 | Discharge: 2012-10-19 | Disposition: A | Discharge status: Skilled Nursing Facility | Payer: Medicare Other | Attending: Emergency Medicine | Admitting: Emergency Medicine

## 2012-10-18 DIAGNOSIS — S0990XA Unspecified injury of head, initial encounter: Secondary | ICD-10-CM | POA: Insufficient documentation

## 2012-10-18 DIAGNOSIS — Z8719 Personal history of other diseases of the digestive system: Secondary | ICD-10-CM | POA: Insufficient documentation

## 2012-10-18 DIAGNOSIS — Z8679 Personal history of other diseases of the circulatory system: Secondary | ICD-10-CM | POA: Insufficient documentation

## 2012-10-18 DIAGNOSIS — Z860100 Personal history of colon polyps, unspecified: Secondary | ICD-10-CM | POA: Insufficient documentation

## 2012-10-18 DIAGNOSIS — Z7982 Long term (current) use of aspirin: Secondary | ICD-10-CM | POA: Insufficient documentation

## 2012-10-18 DIAGNOSIS — E785 Hyperlipidemia, unspecified: Secondary | ICD-10-CM | POA: Insufficient documentation

## 2012-10-18 DIAGNOSIS — Y9210 Unspecified residential institution as the place of occurrence of the external cause: Secondary | ICD-10-CM | POA: Insufficient documentation

## 2012-10-18 DIAGNOSIS — Z87891 Personal history of nicotine dependence: Secondary | ICD-10-CM | POA: Insufficient documentation

## 2012-10-18 DIAGNOSIS — W2203XA Walked into furniture, initial encounter: Secondary | ICD-10-CM | POA: Insufficient documentation

## 2012-10-18 DIAGNOSIS — J309 Allergic rhinitis, unspecified: Secondary | ICD-10-CM | POA: Insufficient documentation

## 2012-10-18 DIAGNOSIS — Z79899 Other long term (current) drug therapy: Secondary | ICD-10-CM | POA: Insufficient documentation

## 2012-10-18 DIAGNOSIS — F068 Other specified mental disorders due to known physiological condition: Secondary | ICD-10-CM | POA: Insufficient documentation

## 2012-10-18 DIAGNOSIS — Y9389 Activity, other specified: Secondary | ICD-10-CM | POA: Insufficient documentation

## 2012-10-18 DIAGNOSIS — E039 Hypothyroidism, unspecified: Secondary | ICD-10-CM | POA: Insufficient documentation

## 2012-10-18 DIAGNOSIS — Z8739 Personal history of other diseases of the musculoskeletal system and connective tissue: Secondary | ICD-10-CM | POA: Insufficient documentation

## 2012-10-18 MED ORDER — LORAZEPAM 1 MG PO TABS
1.0000 mg | ORAL_TABLET | Freq: Once | ORAL | Status: AC
  Administered 2012-10-18: 1 mg via ORAL
  Filled 2012-10-18: qty 1

## 2012-10-18 MED ORDER — HALOPERIDOL LACTATE 5 MG/ML IJ SOLN
5.0000 mg | Freq: Once | INTRAMUSCULAR | Status: AC
  Administered 2012-10-18: 5 mg via INTRAMUSCULAR
  Filled 2012-10-18: qty 1

## 2012-10-18 NOTE — ED Notes (Signed)
Pt sleeping at this time, will attempt to complete ct

## 2012-10-18 NOTE — ED Provider Notes (Signed)
History     CSN: [id number]  Arrival date & time 10/18/12  2107   First MD Initiated Contact with Patient 10/18/12 2116      Chief Complaint  Patient presents with  . Head Injury    (Consider location/radiation/quality/duration/timing/severity/associated sxs/prior treatment) HPI Comments: Patient presents with head injury after bending over to pick something up and hitting her head on the table. This was unwitnessed. She has dementia at baseline and is no more confused than usual per her son. She takes a daily aspirin but not Coumadin. There is no vomiting, weakness, numbness or tingling. There is no change in mental status.  The history is provided by the patient, the EMS personnel and a relative. The history is limited by the condition of the patient.    Past Medical History  Diagnosis Date  . Hypothyroid   . Allergic rhinitis   . Osteoporosis   . Menopause 2000  . Other and unspecified hyperlipidemia   . Tobacco use disorder   . Rectal polyp 04/2005  . Sigmoid diverticulosis   . Internal hemorrhoids   . Dementia     Past Surgical History  Procedure Date  . Tonsillectomy and adenoidectomy   . Appendectomy     History reviewed. No pertinent family history.  History  Substance Use Topics  . Smoking status: Former Smoker    Quit date: 12/10/2007  . Smokeless tobacco: Never Used  . Alcohol Use: Yes     Comment: occ    OB History    Grav Para Term Preterm Abortions TAB SAB Ect Mult Living                  Review of Systems  Unable to perform ROS: Dementia    Allergies  Sulfa antibiotics  Home Medications   Current Outpatient Rx  Name  Route  Sig  Dispense  Refill  . ACETAMINOPHEN 500 MG PO TABS   Oral   Take 500 mg by mouth every 6 (six) hours as needed.         . ASPIRIN 81 MG PO TABS   Oral   Take 81 mg by mouth daily.         Marland Kitchen VITAMIN D 1000 UNITS PO TABS   Oral   Take 5,000 Units by mouth daily.          . DONEPEZIL HCL 5 MG PO  TABS   Oral   Take 10 mg by mouth at bedtime as needed.          Marland Kitchen LEVOTHYROXINE SODIUM 100 MCG PO TABS   Oral   Take 1 tablet (100 mcg total) by mouth daily.   30 tablet   5   . MEMANTINE HCL 10 MG PO TABS   Oral   Take 10 mg by mouth 2 (two) times daily.         Marland Kitchen DAILY VITE PO TABS      TAKE 1 TABLET BY MOUTH EACH MORNING WITH MEAL.   30 each   5   . NON FORMULARY               . QC ASPIRIN LOW DOSE 81 MG PO TBEC      TAKE ONE TABLET BY MOUTH ONCE DAILY.   30 tablet   5   . ROSUVASTATIN CALCIUM 20 MG PO TABS   Oral   Take 1 tablet (20 mg total) by mouth daily.   30 tablet   5   .  BAZA PROTECT EX CREA      PROVIDE AND APPLY TO BUTTOCK TWICE DAILY.   142 g   1     BP 147/67  Pulse 66  Temp 98.8 F (37.1 C) (Oral)  Resp 20  SpO2 99%  Physical Exam  Constitutional: No distress.  HENT:  Head: Normocephalic and atraumatic.  Mouth/Throat: Oropharynx is clear and moist. No oropharyngeal exudate.       Hematoma L forehead  Eyes: Conjunctivae normal and EOM are normal. Pupils are equal, round, and reactive to light.  Neck: Normal range of motion.       No C-spine tenderness, step-off or deformity  Cardiovascular: Normal rate, regular rhythm and normal heart sounds.   No murmur heard. Pulmonary/Chest: Effort normal and breath sounds normal. No respiratory distress.  Abdominal: Soft. There is no tenderness. There is no rebound and no guarding.  Musculoskeletal: Normal range of motion. She exhibits no edema and no tenderness.       No T. or L-spine tenderness  Neurological: She is alert.       Confused, oriented to self. Moves all extremities and follows commands.  Skin: Skin is warm.    ED Course  Procedures (including critical care time)  Labs Reviewed - No data to display Ct Head Wo Contrast  10/18/2012  *RADIOLOGY REPORT*  Clinical Data:  Trauma.  Hematoma on the forehead.  Alzheimer's disease.  CT HEAD WITHOUT CONTRAST CT CERVICAL SPINE  WITHOUT CONTRAST  Technique:  Multidetector CT imaging of the head and cervical spine was performed following the standard protocol without intravenous contrast.  Multiplanar CT image reconstructions of the cervical spine were also generated.  Comparison:   None  CT HEAD  Findings: No mass lesion, mass effect, midline shift, hydrocephalus, hemorrhage.  No acute territorial cortical ischemia/infarct. Atrophy and chronic ischemic white matter disease is present.  Calvarium intact.  Left forehead hematoma.  Paranasal sinuses appear within normal limits.  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 Globes appear intact.  Visualized nasal bones intact.  IMPRESSION: Atrophy and chronic ischemic white matter disease without acute intracranial abnormality.  CT CERVICAL SPINE  Findings: Lung apices demonstrate bilateral pleural apical scarring.  Carotid atherosclerosis.  Multilevel cervical facet arthrosis and uncovertebral spurring.  There is no cervical spine fracture or dislocation.  Mid cervical degenerative disc disease. Between 1 mm and 2 mm anterolisthesis of C4 and C5 appears degenerative, associated with facet arthrosis.  Multilevel foraminal encroachment associated with degenerative disease.  IMPRESSION: No acute abnormality.  Multilevel cervical spondylosis and facet arthrosis.   Original Report Authenticated By: Andreas Newport, M.D.    Ct Cervical Spine Wo Contrast  10/18/2012  *RADIOLOGY REPORT*  Clinical Data:  Trauma.  Hematoma on the forehead.  Alzheimer's disease.  CT HEAD WITHOUT CONTRAST CT CERVICAL SPINE WITHOUT CONTRAST  Technique:  Multidetector CT imaging of the head and cervical spine was performed following the standard protocol without intravenous contrast.  Multiplanar CT image reconstructions of the cervical spine were also generated.  Comparison:   None  CT HEAD  Findings: No mass lesion, mass effect, midline shift, hydrocephalus, hemorrhage.  No acute  territorial cortical ischemia/infarct. Atrophy and chronic ischemic white matter disease is present.  Calvarium intact.  Left forehead hematoma.  Paranasal sinuses appear within normal limits.  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 Globes appear intact.  Visualized nasal bones intact.  IMPRESSION: Atrophy and chronic ischemic white matter disease without acute intracranial abnormality.  CT CERVICAL  SPINE  Findings: Lung apices demonstrate bilateral pleural apical scarring.  Carotid atherosclerosis.  Multilevel cervical facet arthrosis and uncovertebral spurring.  There is no cervical spine fracture or dislocation.  Mid cervical degenerative disc disease. Between 1 mm and 2 mm anterolisthesis of C4 and C5 appears degenerative, associated with facet arthrosis.  Multilevel foraminal encroachment associated with degenerative disease.  IMPRESSION: No acute abnormality.  Multilevel cervical spondylosis and facet arthrosis.   Original Report Authenticated By: Andreas Newport, M.D.      1. Head injury       MDM  Head injury after hitting head. No loss of consciousness. No change in baseline confusion. No anticoagulants.  Patient required chemical restraint to obtain imaging.  Negative for acute pathology.  At baseline mentation per family. Stable for return to facility with head injury instructions.      Glynn Octave, MD 10/18/12 [phone]

## 2012-10-18 NOTE — ED Notes (Signed)
Pt was sitting in chair at facility and bent down to pick something up and bumped her head on table

## 2012-10-18 NOTE — ED Notes (Signed)
Family requesting to not do ct scan at this time

## 2012-10-18 NOTE — ED Notes (Signed)
Pt with advanced alzheimers, bumped head on dinning room table and has large hematoma above left eyebrown, pt immobilized in c-collar

## 2012-10-18 NOTE — ED Notes (Signed)
MD at bedside. 

## 2012-10-18 NOTE — ED Notes (Signed)
Family at bedside. 

## 2012-10-18 NOTE — ED Notes (Signed)
Family at bedside.family informed of treatment plan

## 2012-12-11 ENCOUNTER — Emergency Department (HOSPITAL_BASED_OUTPATIENT_CLINIC_OR_DEPARTMENT_OTHER): Payer: Medicare Other

## 2012-12-11 ENCOUNTER — Encounter (HOSPITAL_BASED_OUTPATIENT_CLINIC_OR_DEPARTMENT_OTHER): Payer: Self-pay | Admitting: Family Medicine

## 2012-12-11 ENCOUNTER — Emergency Department (HOSPITAL_BASED_OUTPATIENT_CLINIC_OR_DEPARTMENT_OTHER)
Admission: EM | Admit: 2012-12-11 | Discharge: 2012-12-11 | Disposition: A | Discharge status: Home / Self Care | Payer: Medicare Other | Attending: Emergency Medicine | Admitting: Emergency Medicine

## 2012-12-11 DIAGNOSIS — Z791 Long term (current) use of non-steroidal anti-inflammatories (NSAID): Secondary | ICD-10-CM | POA: Insufficient documentation

## 2012-12-11 DIAGNOSIS — S065X9A Traumatic subdural hemorrhage with loss of consciousness of unspecified duration, initial encounter: Secondary | ICD-10-CM | POA: Insufficient documentation

## 2012-12-11 DIAGNOSIS — F17200 Nicotine dependence, unspecified, uncomplicated: Secondary | ICD-10-CM | POA: Insufficient documentation

## 2012-12-11 DIAGNOSIS — E785 Hyperlipidemia, unspecified: Secondary | ICD-10-CM | POA: Insufficient documentation

## 2012-12-11 DIAGNOSIS — Z8659 Personal history of other mental and behavioral disorders: Secondary | ICD-10-CM | POA: Insufficient documentation

## 2012-12-11 DIAGNOSIS — Z8742 Personal history of other diseases of the female genital tract: Secondary | ICD-10-CM | POA: Insufficient documentation

## 2012-12-11 DIAGNOSIS — Z87891 Personal history of nicotine dependence: Secondary | ICD-10-CM | POA: Insufficient documentation

## 2012-12-11 DIAGNOSIS — Y939 Activity, unspecified: Secondary | ICD-10-CM | POA: Insufficient documentation

## 2012-12-11 DIAGNOSIS — Z8739 Personal history of other diseases of the musculoskeletal system and connective tissue: Secondary | ICD-10-CM | POA: Insufficient documentation

## 2012-12-11 DIAGNOSIS — I6203 Nontraumatic chronic subdural hemorrhage: Secondary | ICD-10-CM

## 2012-12-11 DIAGNOSIS — S0993XA Unspecified injury of face, initial encounter: Secondary | ICD-10-CM | POA: Insufficient documentation

## 2012-12-11 DIAGNOSIS — Z79899 Other long term (current) drug therapy: Secondary | ICD-10-CM | POA: Insufficient documentation

## 2012-12-11 DIAGNOSIS — Z7982 Long term (current) use of aspirin: Secondary | ICD-10-CM | POA: Insufficient documentation

## 2012-12-11 DIAGNOSIS — S199XXA Unspecified injury of neck, initial encounter: Secondary | ICD-10-CM | POA: Insufficient documentation

## 2012-12-11 DIAGNOSIS — Z8719 Personal history of other diseases of the digestive system: Secondary | ICD-10-CM | POA: Insufficient documentation

## 2012-12-11 DIAGNOSIS — E039 Hypothyroidism, unspecified: Secondary | ICD-10-CM | POA: Insufficient documentation

## 2012-12-11 DIAGNOSIS — S065XAA Traumatic subdural hemorrhage with loss of consciousness status unknown, initial encounter: Secondary | ICD-10-CM | POA: Insufficient documentation

## 2012-12-11 DIAGNOSIS — W19XXXA Unspecified fall, initial encounter: Secondary | ICD-10-CM | POA: Insufficient documentation

## 2012-12-11 DIAGNOSIS — J309 Allergic rhinitis, unspecified: Secondary | ICD-10-CM | POA: Insufficient documentation

## 2012-12-11 DIAGNOSIS — Z8679 Personal history of other diseases of the circulatory system: Secondary | ICD-10-CM | POA: Insufficient documentation

## 2012-12-11 DIAGNOSIS — Y9289 Other specified places as the place of occurrence of the external cause: Secondary | ICD-10-CM | POA: Insufficient documentation

## 2012-12-11 MED ORDER — HALOPERIDOL LACTATE 5 MG/ML IJ SOLN: 2.0000 mg | Freq: Once | INTRAMUSCULAR | Status: DC

## 2012-12-11 NOTE — ED Notes (Signed)
Per son, pt had unwitnessed fall at [facility] this morning at approx 0815. Pt has hematoma to left eye. Baseline mental status per son and nursing staff at facility.

## 2012-12-11 NOTE — ED Provider Notes (Signed)
History     CSN: [id number]  Arrival date & time 12/11/12  0925   First MD Initiated Contact with Patient 12/11/12 0935      Chief Complaint  Patient presents with  . Fall  . Head Injury    (Consider location/radiation/quality/duration/timing/severity/associated sxs/prior treatment) HPI The patient presents from her nursing facility after an unwitnessed fall.  She has a history of dementia, and walks constantly.  Today, in the hours prior to arrival the patient was found on the floor, at the end of the hallway.  There is not a significant amount time that elapsed prior to her being found since the most recent observation of her walking.  The patient cannot provide any element of the history of present illness. The patient's children provide the history of present illness. He states that the patient was on the ground, was able to walk afterwards, but seems to complain of head face neck pain.  They state that she has a history of similar falls, was evaluated here approximately one month ago.  That point she required chemical sedation for evaluation. The patient awakens easily for my evaluation, but falls asleep again quickly.  When awakened, she moves all extremities, inconsistently indicates that there is pain in her head, face, neck.  Past Medical History  Diagnosis Date  . Hypothyroid   . Allergic rhinitis   . Osteoporosis   . Menopause 2000  . Other and unspecified hyperlipidemia   . Tobacco use disorder   . Rectal polyp 04/2005  . Sigmoid diverticulosis   . Internal hemorrhoids   . Dementia     Past Surgical History  Procedure Date  . Tonsillectomy and adenoidectomy   . Appendectomy     No family history on file.  History  Substance Use Topics  . Smoking status: Former Smoker    Quit date: 12/10/2007  . Smokeless tobacco: Never Used  . Alcohol Use: Yes     Comment: occ    OB History    Grav Para Term Preterm Abortions TAB SAB Ect Mult Living                   Review of Systems  Unable to perform ROS: Dementia    Allergies  Lorazepam and Sulfa antibiotics  Home Medications   Current Outpatient Rx  Name  Route  Sig  Dispense  Refill  . CELECOXIB 100 MG PO CAPS   Oral   Take 100 mg by mouth 2 (two) times daily.         Marland Kitchen CLONAZEPAM 0.5 MG PO TABS   Oral   Take 0.5 mg by mouth 3 (three) times daily as needed.         Marland Kitchen RISPERIDONE 0.25 MG PO TABS   Oral   Take 0.25 mg by mouth daily at 8 pm.         . TRAMADOL HCL 50 MG PO TABS   Oral   Take 50 mg by mouth every 6 (six) hours as needed.         . ACETAMINOPHEN 500 MG PO TABS   Oral   Take 500 mg by mouth every 6 (six) hours as needed.         . ASPIRIN 81 MG PO TABS   Oral   Take 81 mg by mouth daily.         Marland Kitchen VITAMIN D 1000 UNITS PO TABS   Oral   Take 5,000 Units by mouth daily.          Marland Kitchen  DONEPEZIL HCL 5 MG PO TABS   Oral   Take 10 mg by mouth at bedtime as needed.          Marland Kitchen LEVOTHYROXINE SODIUM 100 MCG PO TABS   Oral   Take 1 tablet (100 mcg total) by mouth daily.   30 tablet   5   . MEMANTINE HCL 10 MG PO TABS   Oral   Take 10 mg by mouth 2 (two) times daily.         Marland Kitchen DAILY VITE PO TABS      TAKE 1 TABLET BY MOUTH EACH MORNING WITH MEAL.   30 each   5   . NON FORMULARY               . QC ASPIRIN LOW DOSE 81 MG PO TBEC      TAKE ONE TABLET BY MOUTH ONCE DAILY.   30 tablet   5   . ROSUVASTATIN CALCIUM 20 MG PO TABS   Oral   Take 1 tablet (20 mg total) by mouth daily.   30 tablet   5   . BAZA PROTECT EX CREA      PROVIDE AND APPLY TO BUTTOCK TWICE DAILY.   142 g   1     BP 108/57  Pulse 64  Resp 16  SpO2 100%  Physical Exam  Nursing note and vitals reviewed. Constitutional: She is oriented to person, place, and time. She appears well-developed and well-nourished. No distress.  HENT:  Head: Normocephalic and atraumatic.    Mouth/Throat: Mucous membranes are normal.  Eyes: Conjunctivae normal and EOM  are normal.  Neck: Spinous process tenderness and muscular tenderness present. No rigidity.  Cardiovascular: Normal rate and regular rhythm.   Pulmonary/Chest: Effort normal and breath sounds normal. No stridor. No respiratory distress.  Abdominal: She exhibits no distension.  Musculoskeletal: She exhibits no edema.       No evidence of significant trauma in the lower extremities.  The patient flexes both hips spontaneously, with full range of motion demonstrated, no indication of pain throughout.  The pelvis is stable.  The legs are of equal length, with no tenderness to palpation throughout.  Neurological: She is alert and oriented to person, place, and time. No cranial nerve deficit.       She was ultimately spontaneously, but not to command.  Eyes are reactive, and she tracks, though inconsistently   Skin: Skin is warm and dry.  Psychiatric: Her mood appears anxious. Cognition and memory are impaired. She exhibits abnormal recent memory and abnormal remote memory.       Patient is largely noncommunicative    ED Course  Procedures (including critical care time)  Labs Reviewed - No data to display No results found.   No diagnosis found.  Pulse ox 99% room air normal  I discussed all radiology findings with our radiologist.  Subsequently I discussed the findings with the patient's family.  They confirm the patient is in no significant behavioral changes, but with her dementia, ongoing difficulty in determining this.  The patient awakens easily, continues to move all extremities spontaneously.  We discussed the need for outpatient followup, and the patient was discharged to her nursing facility. MDM  This patient presents following a fall, unwitnessed, with new facial trauma.  The patient is demented, limiting the reliability of the history of present illness.  Given her history, she had multiple radiographic studies done.  These are notable for demonstration of a seemingly  old subdural  hematoma.  After lengthy discussion with the patient's family, confirmation that there been no significant new changes from her baseline, which is altered, she was discharged back to her nursing facility.  Gerhard Munch, MD 12/11/12 1102

## 2012-12-12 ENCOUNTER — Emergency Department (HOSPITAL_BASED_OUTPATIENT_CLINIC_OR_DEPARTMENT_OTHER)
Admission: EM | Admit: 2012-12-12 | Discharge: 2012-12-12 | Disposition: A | Discharge status: Home / Self Care | Payer: Medicare Other | Attending: Emergency Medicine | Admitting: Emergency Medicine

## 2012-12-12 ENCOUNTER — Emergency Department (HOSPITAL_BASED_OUTPATIENT_CLINIC_OR_DEPARTMENT_OTHER): Payer: Medicare Other

## 2012-12-12 ENCOUNTER — Encounter (HOSPITAL_BASED_OUTPATIENT_CLINIC_OR_DEPARTMENT_OTHER): Payer: Self-pay | Admitting: *Deleted

## 2012-12-12 DIAGNOSIS — Z79899 Other long term (current) drug therapy: Secondary | ICD-10-CM | POA: Insufficient documentation

## 2012-12-12 DIAGNOSIS — F0390 Unspecified dementia without behavioral disturbance: Secondary | ICD-10-CM | POA: Insufficient documentation

## 2012-12-12 DIAGNOSIS — E785 Hyperlipidemia, unspecified: Secondary | ICD-10-CM | POA: Insufficient documentation

## 2012-12-12 DIAGNOSIS — Z87891 Personal history of nicotine dependence: Secondary | ICD-10-CM | POA: Insufficient documentation

## 2012-12-12 DIAGNOSIS — Z7982 Long term (current) use of aspirin: Secondary | ICD-10-CM | POA: Insufficient documentation

## 2012-12-12 DIAGNOSIS — IMO0002 Reserved for concepts with insufficient information to code with codable children: Secondary | ICD-10-CM | POA: Insufficient documentation

## 2012-12-12 DIAGNOSIS — E039 Hypothyroidism, unspecified: Secondary | ICD-10-CM | POA: Insufficient documentation

## 2012-12-12 DIAGNOSIS — Z791 Long term (current) use of non-steroidal anti-inflammatories (NSAID): Secondary | ICD-10-CM | POA: Insufficient documentation

## 2012-12-12 DIAGNOSIS — Z860100 Personal history of colon polyps, unspecified: Secondary | ICD-10-CM | POA: Insufficient documentation

## 2012-12-12 DIAGNOSIS — Z8719 Personal history of other diseases of the digestive system: Secondary | ICD-10-CM | POA: Insufficient documentation

## 2012-12-12 DIAGNOSIS — N390 Urinary tract infection, site not specified: Secondary | ICD-10-CM | POA: Insufficient documentation

## 2012-12-12 DIAGNOSIS — Z8739 Personal history of other diseases of the musculoskeletal system and connective tissue: Secondary | ICD-10-CM | POA: Insufficient documentation

## 2012-12-12 DIAGNOSIS — R269 Unspecified abnormalities of gait and mobility: Secondary | ICD-10-CM | POA: Insufficient documentation

## 2012-12-12 DIAGNOSIS — Z792 Long term (current) use of antibiotics: Secondary | ICD-10-CM | POA: Insufficient documentation

## 2012-12-12 LAB — COMPREHENSIVE METABOLIC PANEL
ALT: 146 U/L — ABNORMAL HIGH (ref 0–35)
Albumin: 3.7 g/dL (ref 3.5–5.2)
Alkaline Phosphatase: 51 U/L (ref 39–117)
GFR calc Af Amer: 66 mL/min — ABNORMAL LOW (ref >90)
Glucose, Bld: 89 mg/dL (ref 70–99)
Potassium: 4.1 mEq/L (ref 3.5–5.1)
Sodium: 141 mEq/L (ref 135–145)
Total Protein: 6.3 g/dL (ref 6.0–8.3)

## 2012-12-12 LAB — URINE MICROSCOPIC-ADD ON

## 2012-12-12 LAB — CBC WITH DIFFERENTIAL/PLATELET
Eosinophils Relative: 4 % (ref 0–5)
HCT: 32.6 % — ABNORMAL LOW (ref 36.0–46.0)
Hemoglobin: 10.9 g/dL — ABNORMAL LOW (ref 12.0–15.0)
Lymphocytes Relative: 30 % (ref 12–46)
Lymphs Abs: 1.8 10*3/uL (ref 0.7–4.0)
MCV: 91.1 fL (ref 78.0–100.0)
Monocytes Absolute: 0.5 10*3/uL (ref 0.1–1.0)
RBC: 3.58 MIL/uL — ABNORMAL LOW (ref 3.87–5.11)
WBC: 6 10*3/uL (ref 4.0–10.5)

## 2012-12-12 LAB — URINALYSIS, ROUTINE W REFLEX MICROSCOPIC
Glucose, UA: NEGATIVE mg/dL
Specific Gravity, Urine: 1.025 (ref 1.005–1.030)

## 2012-12-12 LAB — VALPROIC ACID LEVEL: Valproic Acid Lvl: 12.2 ug/mL — ABNORMAL LOW (ref 50.0–100.0)

## 2012-12-12 MED ORDER — SODIUM CHLORIDE 0.9 % IV SOLN
1000.0000 mL | INTRAVENOUS | Status: DC
  Administered 2012-12-12: 1000 mL via INTRAVENOUS

## 2012-12-12 MED ORDER — CEFUROXIME AXETIL 250 MG PO TABS: 250.0000 mg | ORAL_TABLET | Freq: Two times a day (BID) | ORAL | Status: DC

## 2012-12-12 MED ORDER — HALOPERIDOL LACTATE 5 MG/ML IJ SOLN
2.0000 mg | Freq: Once | INTRAMUSCULAR | Status: AC
  Administered 2012-12-12: 2 mg via INTRAMUSCULAR
  Filled 2012-12-12: qty 1

## 2012-12-12 NOTE — ED Notes (Signed)
Family members states pt fell yesterday and today is having an increase in her agitation, problems with her gait and holding her head down. Pt is agitated and uncooperative in the triage level.

## 2012-12-12 NOTE — ED Notes (Signed)
Pt fell yesterday and was seen here. She had a CT of her head. Right sided weakness this am per family. Bruising to the left side of her face. Bruise to her right hip and right elbow. She is combative at times per family. Resting at present.

## 2012-12-12 NOTE — ED Provider Notes (Signed)
History     CSN: [id number]  Arrival date & time 12/12/12  1756   First MD Initiated Contact with Patient 12/12/12 1848      Chief complaint: Change in mental status Level V caveat: Severe dementia  HPI The patient presents to the emergency room because of a change in her behavior. The patient has severe dementia. At baseline she is not able to communicate anymore but she generally does walk around and is very active at her nursing facility. She had a fall yesterday and was seen in the emergency department. A CT scan was performed at that time. The report indicated no acute abnormalities but a small chronic subdural was noted.  Patient had x-rays of her cervical spine and face without acute abnormalities noted and was released back to her nursing facility. The staff at the facility today found that the patient was having difficulty with her gait. She was falling more towards the right and holding her head down. The patient at baseline is very agitated and uncooperative with the staff were unable to do any further assessment.  Family states that she does not seem to be as active as usual. She has been dealing with a chronic UTI and has been on antibiotics at her nursing facility.. Otherwise she has not had any issues nausea vomiting fever. Past Medical History  Diagnosis Date  . Hypothyroid   . Allergic rhinitis   . Osteoporosis   . Menopause 2000  . Other and unspecified hyperlipidemia   . Tobacco use disorder   . Rectal polyp 04/2005  . Sigmoid diverticulosis   . Internal hemorrhoids   . Dementia     Past Surgical History  Procedure Date  . Tonsillectomy and adenoidectomy   . Appendectomy     History reviewed. No pertinent family history.  History  Substance Use Topics  . Smoking status: Former Smoker    Quit date: 12/10/2007  . Smokeless tobacco: Never Used  . Alcohol Use: Yes     Comment: occ    OB History    Grav Para Term Preterm Abortions TAB SAB Ect Mult Living                   Review of Systems  All other systems reviewed and are negative.    Allergies  Lorazepam and Sulfa antibiotics  Home Medications   Current Outpatient Rx  Name  Route  Sig  Dispense  Refill  . ACETAMINOPHEN 500 MG PO TABS   Oral   Take 500 mg by mouth every 6 (six) hours as needed.         . ASPIRIN 81 MG PO TABS   Oral   Take 81 mg by mouth daily.         . CELECOXIB 100 MG PO CAPS   Oral   Take 100 mg by mouth 2 (two) times daily.         Marland Kitchen VITAMIN D 1000 UNITS PO TABS   Oral   Take 5,000 Units by mouth daily.          Marland Kitchen CLONAZEPAM 0.5 MG PO TABS   Oral   Take 0.5 mg by mouth 3 (three) times daily as needed.         . DONEPEZIL HCL 5 MG PO TABS   Oral   Take 10 mg by mouth at bedtime as needed.          Marland Kitchen LEVOTHYROXINE SODIUM 100 MCG PO TABS  Oral   Take 1 tablet (100 mcg total) by mouth daily.   30 tablet   5   . MEMANTINE HCL 10 MG PO TABS   Oral   Take 10 mg by mouth 2 (two) times daily.         Marland Kitchen DAILY VITE PO TABS      TAKE 1 TABLET BY MOUTH EACH MORNING WITH MEAL.   30 each   5   . NON FORMULARY               . QC ASPIRIN LOW DOSE 81 MG PO TBEC      TAKE ONE TABLET BY MOUTH ONCE DAILY.   30 tablet   5   . RISPERIDONE 0.25 MG PO TABS   Oral   Take 0.25 mg by mouth daily at 8 pm.         . ROSUVASTATIN CALCIUM 20 MG PO TABS   Oral   Take 1 tablet (20 mg total) by mouth daily.   30 tablet   5   . BAZA PROTECT EX CREA      PROVIDE AND APPLY TO BUTTOCK TWICE DAILY.   142 g   1   . TRAMADOL HCL 50 MG PO TABS   Oral   Take 50 mg by mouth every 6 (six) hours as needed.           BP 110/70  Temp 97.4 F (36.3 C) (Axillary)  Resp 22  SpO2 99%  Physical Exam  Nursing note and vitals reviewed. Constitutional: She appears well-developed and well-nourished. She appears lethargic. No distress.  HENT:  Head: Normocephalic.  Right Ear: External ear normal.  Left Ear: External ear normal.        Contusion around the left periorbital region  Eyes: Conjunctivae normal are normal. Right eye exhibits no discharge. Left eye exhibits no discharge. No scleral icterus.  Neck: Neck supple. No tracheal deviation present.  Cardiovascular: Normal rate, regular rhythm and intact distal pulses.   Pulmonary/Chest: Effort normal and breath sounds normal. No stridor. No respiratory distress. She has no wheezes. She has no rales.  Abdominal: Soft. Bowel sounds are normal. She exhibits no distension. There is no tenderness. There is no rebound and no guarding.  Musculoskeletal: She exhibits no edema and no tenderness.  Neurological: She has normal strength. She appears lethargic. She is disoriented. She displays no atrophy and no tremor. No sensory deficit. Cranial nerve deficit:  no gross defecits noted , no facial asymmetry. She exhibits normal muscle tone. She displays no seizure activity. Coordination abnormal. GCS eye subscore is 4. GCS verbal subscore is 3. GCS motor subscore is 5.       Patient does not answer any questions, patient does not follow commands but does move all extremities  Skin: Skin is warm and dry. No rash noted.  Psychiatric: She has a normal mood and affect.    ED Course  Procedures (including critical care time)  Rate: 65  Rhythm: normal sinus rhythm  QRS Axis: normal  Intervals: normal  ST/T Wave abnormalities: normal  Conduction Disutrbances:none  Narrative Interpretation: nl  Old EKG Reviewed: none available  Labs Reviewed  COMPREHENSIVE METABOLIC PANEL - Abnormal; Notable for the following:    AST 93 (*)     ALT 146 (*)     GFR calc non Af Amer 57 (*)     GFR calc Af Amer 66 (*)     All other components within normal limits  URINALYSIS, ROUTINE W REFLEX MICROSCOPIC - Abnormal; Notable for the following:    APPearance CLOUDY (*)     Hgb urine dipstick LARGE (*)     Ketones, ur 15 (*)     Leukocytes, UA MODERATE (*)     All other components within normal limits   CBC WITH DIFFERENTIAL - Abnormal; Notable for the following:    RBC 3.58 (*)     Hemoglobin 10.9 (*)     HCT 32.6 (*)     Platelets 114 (*)  PLATELET COUNT CONFIRMED BY SMEAR   All other components within normal limits  VALPROIC ACID LEVEL - Abnormal; Notable for the following:    Valproic Acid Lvl 12.2 (*)     All other components within normal limits  PROTIME-INR  AMMONIA  URINE MICROSCOPIC-ADD ON  URINE CULTURE   Ct Head Wo Contrast  12/11/2012  *RADIOLOGY REPORT*  Clinical Data:  Fall with left sided hematoma in the orbital region.  CT HEAD WITHOUT CONTRAST CT MAXILLOFACIAL WITHOUT CONTRAST CT CERVICAL SPINE WITHOUT CONTRAST  Technique:  Multidetector CT imaging of the head, cervical spine, and maxillofacial structures were performed using the standard protocol without intravenous contrast. Multiplanar CT image reconstructions of the cervical spine and maxillofacial structures were also generated.  Comparison:  Head and cervical spine CT from 10/18/2012.  CT HEAD  Findings: There is no evidence for an acute hemorrhage.  No hydrocephalus or intra-axial mass lesion.  No CT evidence for acute infarction.  Diffuse loss of parenchymal volume is consistent with atrophy. Patchy low attenuation in the deep hemispheric and periventricular white matter is nonspecific, but likely reflects chronic microvascular ischemic demyelination.  A thin nearly isointense extra-axial fluid collection is seen over the right frontoparietal region, consistent with chronic thin right subdural hematoma. There is some mild deviation of the midline to the left, but this is stable since the prior study.  The visualized paranasal sinuses and mastoid air cells are clear.  IMPRESSION: Chronic thin right frontoparietal subdural hematoma. This was not present on the previous study but, given the density, does not represent an acute finding.  Atrophy with chronic small vessel white matter ischemic demyelination.  CT MAXILLOFACIAL   Findings:  The mandible is intact although advanced degenerative changes are seen in both temporomandibular joints.  No evidence for maxillary sinus fracture.  The zygomatic arches are intact.  The inferior and medial orbital walls are intact.  No nasal bone fracture.  Paranasal sinuses are clear.  No evidence for fluid in the middle ears.  The globes are symmetric in size and both retain a spherical configuration.  Intraorbital fat is normal in appearance.  IMPRESSION: No evidence for facial bone fracture.  CT CERVICAL SPINE  Findings:   Imaging was obtained from the skull base through the T1- 2 interspace.  No evidence for fracture.  Trace anterolisthesis of C4-5 is probably secondary to the associated facet degeneration at this level.  Loss of disc height is noted at C5-6 and C6-7.  Facets are well-aligned bilaterally with degenerative changes on the right most prominent at the C5-6 level and on the left at C2-3 down to C4- 5.  Normal cervical lordosis is preserved.  No evidence for prevertebral soft tissue swelling.  Pleuroparenchymal scarring is seen in the extreme lung apices.  IMPRESSION: Degenerative changes in the cervical spine without acute bony abnormality.  I personally called the results of this study to Dr. Jeraldine Loots in the emergency department at 1033 hours on 12/11/2012.  Original Report Authenticated By: Kennith Center, M.D.    Ct Cervical Spine Wo Contrast  12/11/2012  *RADIOLOGY REPORT*  Clinical Data:  Fall with left sided hematoma in the orbital region.  CT HEAD WITHOUT CONTRAST CT MAXILLOFACIAL WITHOUT CONTRAST CT CERVICAL SPINE WITHOUT CONTRAST  Technique:  Multidetector CT imaging of the head, cervical spine, and maxillofacial structures were performed using the standard protocol without intravenous contrast. Multiplanar CT image reconstructions of the cervical spine and maxillofacial structures were also generated.  Comparison:  Head and cervical spine CT from 10/18/2012.  CT HEAD   Findings: There is no evidence for an acute hemorrhage.  No hydrocephalus or intra-axial mass lesion.  No CT evidence for acute infarction.  Diffuse loss of parenchymal volume is consistent with atrophy. Patchy low attenuation in the deep hemispheric and periventricular white matter is nonspecific, but likely reflects chronic microvascular ischemic demyelination.  A thin nearly isointense extra-axial fluid collection is seen over the right frontoparietal region, consistent with chronic thin right subdural hematoma. There is some mild deviation of the midline to the left, but this is stable since the prior study.  The visualized paranasal sinuses and mastoid air cells are clear.  IMPRESSION: Chronic thin right frontoparietal subdural hematoma. This was not present on the previous study but, given the density, does not represent an acute finding.  Atrophy with chronic small vessel white matter ischemic demyelination.  CT MAXILLOFACIAL  Findings:  The mandible is intact although advanced degenerative changes are seen in both temporomandibular joints.  No evidence for maxillary sinus fracture.  The zygomatic arches are intact.  The inferior and medial orbital walls are intact.  No nasal bone fracture.  Paranasal sinuses are clear.  No evidence for fluid in the middle ears.  The globes are symmetric in size and both retain a spherical configuration.  Intraorbital fat is normal in appearance.  IMPRESSION: No evidence for facial bone fracture.  CT CERVICAL SPINE  Findings:   Imaging was obtained from the skull base through the T1- 2 interspace.  No evidence for fracture.  Trace anterolisthesis of C4-5 is probably secondary to the associated facet degeneration at this level.  Loss of disc height is noted at C5-6 and C6-7.  Facets are well-aligned bilaterally with degenerative changes on the right most prominent at the C5-6 level and on the left at C2-3 down to C4- 5.  Normal cervical lordosis is preserved.  No evidence for  prevertebral soft tissue swelling.  Pleuroparenchymal scarring is seen in the extreme lung apices.  IMPRESSION: Degenerative changes in the cervical spine without acute bony abnormality.  I personally called the results of this study to Dr. Jeraldine Loots in the emergency department at 1033 hours on 12/11/2012.   Original Report Authenticated By: Kennith Center, M.D.    Ct Maxillofacial Wo Cm  12/11/2012  *RADIOLOGY REPORT*  Clinical Data:  Fall with left sided hematoma in the orbital region.  CT HEAD WITHOUT CONTRAST CT MAXILLOFACIAL WITHOUT CONTRAST CT CERVICAL SPINE WITHOUT CONTRAST  Technique:  Multidetector CT imaging of the head, cervical spine, and maxillofacial structures were performed using the standard protocol without intravenous contrast. Multiplanar CT image reconstructions of the cervical spine and maxillofacial structures were also generated.  Comparison:  Head and cervical spine CT from 10/18/2012.  CT HEAD  Findings: There is no evidence for an acute hemorrhage.  No hydrocephalus or intra-axial mass lesion.  No CT evidence for acute infarction.  Diffuse loss of parenchymal volume is consistent with atrophy. Patchy low  attenuation in the deep hemispheric and periventricular white matter is nonspecific, but likely reflects chronic microvascular ischemic demyelination.  A thin nearly isointense extra-axial fluid collection is seen over the right frontoparietal region, consistent with chronic thin right subdural hematoma. There is some mild deviation of the midline to the left, but this is stable since the prior study.  The visualized paranasal sinuses and mastoid air cells are clear.  IMPRESSION: Chronic thin right frontoparietal subdural hematoma. This was not present on the previous study but, given the density, does not represent an acute finding.  Atrophy with chronic small vessel white matter ischemic demyelination.  CT MAXILLOFACIAL  Findings:  The mandible is intact although advanced degenerative  changes are seen in both temporomandibular joints.  No evidence for maxillary sinus fracture.  The zygomatic arches are intact.  The inferior and medial orbital walls are intact.  No nasal bone fracture.  Paranasal sinuses are clear.  No evidence for fluid in the middle ears.  The globes are symmetric in size and both retain a spherical configuration.  Intraorbital fat is normal in appearance.  IMPRESSION: No evidence for facial bone fracture.  CT CERVICAL SPINE  Findings:   Imaging was obtained from the skull base through the T1- 2 interspace.  No evidence for fracture.  Trace anterolisthesis of C4-5 is probably secondary to the associated facet degeneration at this level.  Loss of disc height is noted at C5-6 and C6-7.  Facets are well-aligned bilaterally with degenerative changes on the right most prominent at the C5-6 level and on the left at C2-3 down to C4- 5.  Normal cervical lordosis is preserved.  No evidence for prevertebral soft tissue swelling.  Pleuroparenchymal scarring is seen in the extreme lung apices.  IMPRESSION: Degenerative changes in the cervical spine without acute bony abnormality.  I personally called the results of this study to Dr. Jeraldine Loots in the emergency department at 1033 hours on 12/11/2012.   Original Report Authenticated By: Kennith Center, M.D.      1. Urinary tract infection   2. Dementia       MDM  uti Patient has a UTI. She has been on antibiotics at the facility. I will give her prescription for Ceftin and sent off a urine culture for further analysis.   Frequent falls Patient has severe dementia and does not really take direction anymore. I was unable to perform a thorough neurologic exam although she does seem to be moving all extremities. I discussed the findings with the family and indicated that it is possible she had a stroke. I cannot exclude that and she would need to be admitted to the hospital for further evaluation possible MRI testing. The family  understands this, but they do not want hospitalization this time. They wanted to make sure the there did not appear to be any acute life-threatening event associated with the illness. I did assure that that does not appear to be the case at this time.  Agitation The patient is currently calm and quiet in the emergency department, however she has episodes of agitation. The family is concerned that she has not been sleeping well. They requested dose of Haldol in the emergency department. Previously that was effective in getting her to sleep. She had been given a dose when she was seen in the emergency department in December. I asked the family to discuss this with her doctor at the facility but I did agree to give her a dose of Haldol this evening as  they requested.     Celene Kras, MD 12/12/12 2125

## 2012-12-12 NOTE — ED Notes (Signed)
I&O cath. Pt combative and cursing. Family and EMT assisted in restraining pt while cath was being done. Her son held her arm while I inserted the IV.

## 2012-12-13 LAB — URINE CULTURE
Colony Count: NO GROWTH
Culture: NO GROWTH

## 2014-01-02 ENCOUNTER — Encounter (HOSPITAL_BASED_OUTPATIENT_CLINIC_OR_DEPARTMENT_OTHER): Payer: Self-pay | Admitting: Emergency Medicine

## 2014-01-02 ENCOUNTER — Inpatient Hospital Stay (HOSPITAL_BASED_OUTPATIENT_CLINIC_OR_DEPARTMENT_OTHER)
Admission: EM | Admit: 2014-01-02 | Discharge: 2014-01-07 | DRG: 689 | Disposition: A | Discharge status: Hospice | Payer: Medicare Other | Attending: Internal Medicine | Admitting: Internal Medicine

## 2014-01-02 DIAGNOSIS — Z515 Encounter for palliative care: Secondary | ICD-10-CM

## 2014-01-02 DIAGNOSIS — E039 Hypothyroidism, unspecified: Secondary | ICD-10-CM | POA: Diagnosis present

## 2014-01-02 DIAGNOSIS — Z66 Do not resuscitate: Secondary | ICD-10-CM | POA: Diagnosis present

## 2014-01-02 DIAGNOSIS — G9340 Encephalopathy, unspecified: Secondary | ICD-10-CM | POA: Diagnosis present

## 2014-01-02 DIAGNOSIS — Z87891 Personal history of nicotine dependence: Secondary | ICD-10-CM

## 2014-01-02 DIAGNOSIS — E860 Dehydration: Secondary | ICD-10-CM

## 2014-01-02 DIAGNOSIS — N390 Urinary tract infection, site not specified: Principal | ICD-10-CM | POA: Diagnosis present

## 2014-01-02 DIAGNOSIS — E785 Hyperlipidemia, unspecified: Secondary | ICD-10-CM

## 2014-01-02 DIAGNOSIS — G309 Alzheimer's disease, unspecified: Secondary | ICD-10-CM | POA: Diagnosis present

## 2014-01-02 DIAGNOSIS — M810 Age-related osteoporosis without current pathological fracture: Secondary | ICD-10-CM | POA: Diagnosis present

## 2014-01-02 DIAGNOSIS — K5730 Diverticulosis of large intestine without perforation or abscess without bleeding: Secondary | ICD-10-CM

## 2014-01-02 DIAGNOSIS — E878 Other disorders of electrolyte and fluid balance, not elsewhere classified: Secondary | ICD-10-CM

## 2014-01-02 DIAGNOSIS — A498 Other bacterial infections of unspecified site: Secondary | ICD-10-CM | POA: Diagnosis present

## 2014-01-02 DIAGNOSIS — E870 Hyperosmolality and hypernatremia: Secondary | ICD-10-CM | POA: Diagnosis present

## 2014-01-02 DIAGNOSIS — F0390 Unspecified dementia without behavioral disturbance: Secondary | ICD-10-CM | POA: Diagnosis present

## 2014-01-02 DIAGNOSIS — Z7982 Long term (current) use of aspirin: Secondary | ICD-10-CM

## 2014-01-02 DIAGNOSIS — F0280 Dementia in other diseases classified elsewhere without behavioral disturbance: Secondary | ICD-10-CM | POA: Diagnosis present

## 2014-01-02 DIAGNOSIS — Z79899 Other long term (current) drug therapy: Secondary | ICD-10-CM

## 2014-01-02 LAB — URINALYSIS, ROUTINE W REFLEX MICROSCOPIC
BILIRUBIN URINE: NEGATIVE
Glucose, UA: NEGATIVE mg/dL
Ketones, ur: NEGATIVE mg/dL
NITRITE: POSITIVE — AB
PH: 6 (ref 5.0–8.0)
Protein, ur: NEGATIVE mg/dL
SPECIFIC GRAVITY, URINE: 1.025 (ref 1.005–1.030)
Urobilinogen, UA: 0.2 mg/dL (ref 0.0–1.0)

## 2014-01-02 LAB — CBC WITH DIFFERENTIAL/PLATELET
Basophils Absolute: 0 10*3/uL (ref 0.0–0.1)
Basophils Relative: 0 % (ref 0–1)
EOS ABS: 0.4 10*3/uL (ref 0.0–0.7)
Eosinophils Relative: 5 % (ref 0–5)
HCT: 43.7 % (ref 36.0–46.0)
HEMOGLOBIN: 14.2 g/dL (ref 12.0–15.0)
LYMPHS ABS: 2.5 10*3/uL (ref 0.7–4.0)
LYMPHS PCT: 32 % (ref 12–46)
MCH: 29.8 pg (ref 26.0–34.0)
MCHC: 32.5 g/dL (ref 30.0–36.0)
MCV: 91.6 fL (ref 78.0–100.0)
MONOS PCT: 9 % (ref 3–12)
Monocytes Absolute: 0.7 10*3/uL (ref 0.1–1.0)
NEUTROS ABS: 4.2 10*3/uL (ref 1.7–7.7)
NEUTROS PCT: 54 % (ref 43–77)
Platelets: 144 10*3/uL — ABNORMAL LOW (ref 150–400)
RBC: 4.77 MIL/uL (ref 3.87–5.11)
RDW: 14.2 % (ref 11.5–15.5)
WBC: 7.8 10*3/uL (ref 4.0–10.5)

## 2014-01-02 LAB — URINE MICROSCOPIC-ADD ON

## 2014-01-02 LAB — BASIC METABOLIC PANEL
BUN: 31 mg/dL — AB (ref 6–23)
CHLORIDE: 118 meq/L — AB (ref 96–112)
CO2: 23 meq/L (ref 19–32)
Calcium: 10.2 mg/dL (ref 8.4–10.5)
Creatinine, Ser: 1.1 mg/dL (ref 0.50–1.10)
GFR calc Af Amer: 58 mL/min — ABNORMAL LOW (ref >90)
GFR calc non Af Amer: 50 mL/min — ABNORMAL LOW (ref >90)
GLUCOSE: 108 mg/dL — AB (ref 70–99)
POTASSIUM: 3.9 meq/L (ref 3.7–5.3)
SODIUM: 156 meq/L — AB (ref 137–147)

## 2014-01-02 MED ORDER — SODIUM CHLORIDE 0.9 % IV BOLUS (SEPSIS)
1000.0000 mL | Freq: Once | INTRAVENOUS | Status: AC
  Administered 2014-01-02: 1000 mL via INTRAVENOUS

## 2014-01-02 MED ORDER — ACETAMINOPHEN 650 MG RE SUPP
RECTAL | Status: AC
  Filled 2014-01-02: qty 1

## 2014-01-02 MED ORDER — ONDANSETRON HCL 4 MG/2ML IJ SOLN: 4.0000 mg | Freq: Three times a day (TID) | INTRAMUSCULAR | Status: DC | PRN

## 2014-01-02 MED ORDER — CIPROFLOXACIN IN D5W 400 MG/200ML IV SOLN
400.0000 mg | Freq: Once | INTRAVENOUS | Status: AC
  Administered 2014-01-02: 400 mg via INTRAVENOUS
  Filled 2014-01-02: qty 200

## 2014-01-02 MED ORDER — ACETAMINOPHEN 650 MG RE SUPP
650.0000 mg | Freq: Once | RECTAL | Status: AC
  Administered 2014-01-02: 650 mg via RECTAL

## 2014-01-02 MED ORDER — SODIUM CHLORIDE 0.9 % IV SOLN
INTRAVENOUS | Status: DC
  Administered 2014-01-03: via INTRAVENOUS

## 2014-01-02 MED ORDER — CIPROFLOXACIN 500 MG/5ML (10%) PO SUSR: 500.0000 mg | Freq: Two times a day (BID) | ORAL | Status: DC

## 2014-01-02 MED ORDER — ACETAMINOPHEN 325 MG PO TABS: 650.0000 mg | ORAL_TABLET | Freq: Once | ORAL | Status: DC

## 2014-01-02 NOTE — ED Notes (Signed)
Report given [facility] 

## 2014-01-02 NOTE — ED Notes (Signed)
MD at bedside. 

## 2014-01-02 NOTE — ED Notes (Signed)
Pt's family states that she has had decreased PO intake x 2 days, sxs like this usually indicate UTI

## 2014-01-02 NOTE — ED Notes (Signed)
CBG 122, Temp 98.1 per EMS

## 2014-01-02 NOTE — ED Notes (Signed)
Report provided to RN on 4 n

## 2014-01-02 NOTE — ED Notes (Signed)
Pt is a resident at [facility], normal state per family and EMS. Pt is nonverbal

## 2014-01-02 NOTE — ED Provider Notes (Signed)
CSN: [id number]     Arrival date & time 01/02/14  2046 History  This chart was scribed for Nelia Shi, MD by Nicholos Johns, ED scribe. This patient was seen in room MH12/MH12 and the patient's care was started at 8:59 PM.  Chief Complaint  Patient presents with  . Urinary Tract Infection   LEVEL 5 CAVEAT The history is provided by a relative, the EMS personnel and medical records. The history is limited by the condition of the patient. No language interpreter was used.   HPI Comments: Erika Warren is a 68 y.o. female w/ End Stage Alzheimer's presents to the Emergency Department brought in by EMS with a possible UTI. Son states pt has not been eating as normal. Food intake has gone from 100% 4 days ago to 0% now. Pt normally drinks 32 oz of fluids but is now only drinking 4 oz. Son says her behavior now mimics behavior when she normally has a UTI.  Son also states pt sleeps better when she is reclined. Also states she looks like she is "in some level of pain" due to persistence fidgeting. Pt is a [facility] at [facility].   Past Medical History  Diagnosis Date  . Hypothyroid   . Allergic rhinitis   . Osteoporosis   . Menopause 2000  . Other and unspecified hyperlipidemia   . Tobacco use disorder   . Rectal polyp 04/2005  . Sigmoid diverticulosis   . Internal hemorrhoids   . Dementia    Past Surgical History  Procedure Laterality Date  . Tonsillectomy and adenoidectomy    . Appendectomy     History reviewed. No pertinent family history. History  Substance Use Topics  . Smoking status: Former Smoker    Quit date: 12/10/2007  . Smokeless tobacco: Never Used  . Alcohol Use: No     Comment: occ   OB History   Grav Para Term Preterm Abortions TAB SAB Ect Mult Living                 Review of Systems  Unable to perform ROS   Allergies  Lorazepam and Sulfa antibiotics  Home Medications   Current Outpatient Rx  Name  Route  Sig  Dispense  Refill  .  acetaminophen (TYLENOL) 325 MG tablet   Oral   Take 650 mg by mouth 3 (three) times daily with meals.         Marland Kitchen acetaminophen (TYLENOL) 325 MG tablet   Oral   Take 650 mg by mouth every 6 (six) hours as needed for fever.         Marland Kitchen aspirin 81 MG chewable tablet   Oral   Chew 81 mg by mouth daily.         . calcium carbonate (TUMS - DOSED IN MG ELEMENTAL CALCIUM) 500 MG chewable tablet   Oral   Chew 2 tablets by mouth 3 (three) times daily with meals.         . calcium carbonate (TUMS - DOSED IN MG ELEMENTAL CALCIUM) 500 MG chewable tablet   Oral   Chew 1,000 mg by mouth every 4 (four) hours as needed for indigestion or heartburn.         . Cholecalciferol (VITAMIN D3) 1000 UNITS CHEW   Oral   Chew 1 tablet by mouth 3 (three) times daily.         . Cranberry Fruit 475 MG CAPS   Oral   Take 475 mg by  mouth 2 (two) times daily. May open and mix with food         . guaiFENesin (ROBITUSSIN) 100 MG/5ML SOLN   Oral   Take 10 mLs by mouth every 4 (four) hours as needed for cough or to loosen phlegm.         Marland Kitchen HYDROcodone-acetaminophen (NORCO/VICODIN) 5-325 MG per tablet   Oral   Take 1 tablet by mouth every 6 (six) hours as needed for moderate pain.         Marland Kitchen ibuprofen (ADVIL,MOTRIN) 100 MG/5ML suspension   Oral   Take 400 mg by mouth every 6 (six) hours as needed for mild pain.         Marland Kitchen loratadine (CLARITIN) 10 MG tablet   Oral   Take 10 mg by mouth daily as needed for allergies.         . Melatonin 5 MG TABS   Oral   Take 5 mg by mouth at bedtime as needed (sleep).         . Multiple Vitamins-Minerals (MULTIVITAMIN GUMMIES ADULT PO)   Oral   Take 1 each by mouth daily.         Marland Kitchen POLYETHYLENE GLYCOL 3350 PO   Oral   Take 0.5 each by mouth daily. Patient takes a 1/2 capful in juice of choice         . cephALEXin (KEFLEX) 500 MG capsule   Oral   Take 1 capsule (500 mg total) by mouth every 12 (twelve) hours. For 3 doses   3 capsule   0    . levothyroxine (SYNTHROID, LEVOTHROID) 100 MCG tablet   Oral   Take 1 tablet (100 mcg total) by mouth daily before breakfast.   30 tablet   0   . memantine (NAMENDA) 10 MG tablet   Oral   Take 10 mg by mouth 2 (two) times daily.         Marland Kitchen EXPIRED: rosuvastatin (CRESTOR) 20 MG tablet   Oral   Take 1 tablet (20 mg total) by mouth daily.   30 tablet   5    Triage Vitals: BP 122/100  Pulse 86  Temp(Src) 99.7 F (37.6 C) (Oral)  Resp 18  SpO2 99% Physical Exam  Nursing note and vitals reviewed. Constitutional: She appears well-developed. She appears lethargic. She is uncooperative. No distress.  HENT:  Head: Normocephalic and atraumatic.  Eyes: Pupils are equal, round, and reactive to light.  Neck: Neck supple.  Cardiovascular: Normal rate and intact distal pulses.   Pulmonary/Chest: No respiratory distress. She has no wheezes. She has no rales.  Abdominal: Normal appearance. She exhibits no distension. There is no tenderness. There is no rebound.  Musculoskeletal: Normal range of motion.  Neurological: She appears lethargic. GCS eye subscore is 3. GCS verbal subscore is 2. GCS motor subscore is 5.  Skin: Skin is warm and dry. No rash noted.    ED Course  Procedures  DIAGNOSTIC STUDIES: Oxygen Saturation is 99% on room air, normal by my interpretation.    COORDINATION OF CARE: At 9:07 PM: Discussed treatment plan with patient's son. Patient's son agrees.   Labs Review  Imaging Review Results for orders placed during the hospital encounter of 01/02/14  URINE CULTURE      Result Value Ref Range   Specimen Description URINE, CATHETERIZED     Special Requests Normal     Culture  Setup Time       Value: 01/03/2014 01:27  Performed at [facility] Count       Value: >=100,000 COLONIES/ML     Performed at [facility]   Culture       Value: ESCHERICHIA COLI     Performed at [facility]   Report Status 01/04/2014 FINAL      Organism ID, Bacteria ESCHERICHIA COLI    URINALYSIS, ROUTINE W REFLEX MICROSCOPIC      Result Value Ref Range   Color, Urine YELLOW  YELLOW   APPearance CLOUDY (*) CLEAR   Specific Gravity, Urine 1.025  1.005 - 1.030   pH 6.0  5.0 - 8.0   Glucose, UA NEGATIVE  NEGATIVE mg/dL   Hgb urine dipstick MODERATE (*) NEGATIVE   Bilirubin Urine NEGATIVE  NEGATIVE   Ketones, ur NEGATIVE  NEGATIVE mg/dL   Protein, ur NEGATIVE  NEGATIVE mg/dL   Urobilinogen, UA 0.2  0.0 - 1.0 mg/dL   Nitrite POSITIVE (*) NEGATIVE   Leukocytes, UA SMALL (*) NEGATIVE  CBC WITH DIFFERENTIAL      Result Value Ref Range   WBC 7.8  4.0 - 10.5 K/uL   RBC 4.77  3.87 - 5.11 MIL/uL   Hemoglobin 14.2  12.0 - 15.0 g/dL   HCT 16.1  09.6 - 04.5 %   MCV 91.6  78.0 - 100.0 fL   MCH 29.8  26.0 - 34.0 pg   MCHC 32.5  30.0 - 36.0 g/dL   RDW 40.9  81.1 - 91.4 %   Platelets 144 (*) 150 - 400 K/uL   Neutrophils Relative % 54  43 - 77 %   Neutro Abs 4.2  1.7 - 7.7 K/uL   Lymphocytes Relative 32  12 - 46 %   Lymphs Abs 2.5  0.7 - 4.0 K/uL   Monocytes Relative 9  3 - 12 %   Monocytes Absolute 0.7  0.1 - 1.0 K/uL   Eosinophils Relative 5  0 - 5 %   Eosinophils Absolute 0.4  0.0 - 0.7 K/uL   Basophils Relative 0  0 - 1 %   Basophils Absolute 0.0  0.0 - 0.1 K/uL  BASIC METABOLIC PANEL      Result Value Ref Range   Sodium 156 (*) 137 - 147 mEq/L   Potassium 3.9  3.7 - 5.3 mEq/L   Chloride 118 (*) 96 - 112 mEq/L   CO2 23  19 - 32 mEq/L   Glucose, Bld 108 (*) 70 - 99 mg/dL   BUN 31 (*) 6 - 23 mg/dL   Creatinine, Ser 7.82  0.50 - 1.10 mg/dL   Calcium 95.6  8.4 - 21.3 mg/dL   GFR calc non Af Amer 50 (*) >90 mL/min   GFR calc Af Amer 58 (*) >90 mL/min  URINE MICROSCOPIC-ADD ON      Result Value Ref Range   Squamous Epithelial / LPF FEW (*) RARE   WBC, UA 3-6  <3 WBC/hpf   RBC / HPF 7-10  <3 RBC/hpf   Bacteria, UA MANY (*) RARE  AMMONIA      Result Value Ref Range   Ammonia 41  11 - 60 umol/L  COMPREHENSIVE METABOLIC PANEL       Result Value Ref Range   Sodium 154 (*) 137 - 147 mEq/L   Potassium 3.7  3.7 - 5.3 mEq/L   Chloride 118 (*) 96 - 112 mEq/L   CO2 22  19 - 32 mEq/L   Glucose, Bld 103 (*) 70 -  99 mg/dL   BUN 28 (*) 6 - 23 mg/dL   Creatinine, Ser 1.61  0.50 - 1.10 mg/dL   Calcium 9.6  8.4 - 09.6 mg/dL   Total Protein 7.0  6.0 - 8.3 g/dL   Albumin 3.4 (*) 3.5 - 5.2 g/dL   AST 70 (*) 0 - 37 U/L   ALT 145 (*) 0 - 35 U/L   Alkaline Phosphatase 64  39 - 117 U/L   Total Bilirubin 0.4  0.3 - 1.2 mg/dL   GFR calc non Af Amer 59 (*) >90 mL/min   GFR calc Af Amer 68 (*) >90 mL/min  CBC WITH DIFFERENTIAL      Result Value Ref Range   WBC 6.2  4.0 - 10.5 K/uL   RBC 4.56  3.87 - 5.11 MIL/uL   Hemoglobin 13.7  12.0 - 15.0 g/dL   HCT 04.5  40.9 - 81.1 %   MCV 91.4  78.0 - 100.0 fL   MCH 30.0  26.0 - 34.0 pg   MCHC 32.9  30.0 - 36.0 g/dL   RDW 91.4  78.2 - 95.6 %   Platelets 132 (*) 150 - 400 K/uL   Neutrophils Relative % 50  43 - 77 %   Neutro Abs 3.1  1.7 - 7.7 K/uL   Lymphocytes Relative 36  12 - 46 %   Lymphs Abs 2.2  0.7 - 4.0 K/uL   Monocytes Relative 8  3 - 12 %   Monocytes Absolute 0.5  0.1 - 1.0 K/uL   Eosinophils Relative 6 (*) 0 - 5 %   Eosinophils Absolute 0.4  0.0 - 0.7 K/uL   Basophils Relative 0  0 - 1 %   Basophils Absolute 0.0  0.0 - 0.1 K/uL  TSH      Result Value Ref Range   TSH 0.076 (*) 0.350 - 4.500 uIU/mL  GLUCOSE, CAPILLARY      Result Value Ref Range   Glucose-Capillary 100 (*) 70 - 99 mg/dL  GLUCOSE, CAPILLARY      Result Value Ref Range   Glucose-Capillary 84  70 - 99 mg/dL   Comment 1 Documented in Chart     Comment 2 Notify RN    GLUCOSE, CAPILLARY      Result Value Ref Range   Glucose-Capillary 112 (*) 70 - 99 mg/dL   Comment 1 Documented in Chart     Comment 2 Notify RN    GLUCOSE, CAPILLARY      Result Value Ref Range   Glucose-Capillary 102 (*) 70 - 99 mg/dL   Comment 1 Notify RN     Comment 2 Documented in Chart    GLUCOSE, CAPILLARY      Result Value  Ref Range   Glucose-Capillary 96  70 - 99 mg/dL  GLUCOSE, CAPILLARY      Result Value Ref Range   Glucose-Capillary 90  70 - 99 mg/dL   Comment 1 Documented in Chart    GLUCOSE, CAPILLARY      Result Value Ref Range   Glucose-Capillary 90  70 - 99 mg/dL  BASIC METABOLIC PANEL      Result Value Ref Range   Sodium 140  137 - 147 mEq/L   Potassium 3.6 (*) 3.7 - 5.3 mEq/L   Chloride 106  96 - 112 mEq/L   CO2 22  19 - 32 mEq/L   Glucose, Bld 105 (*) 70 - 99 mg/dL   BUN 15  6 - 23 mg/dL  Creatinine, Ser 0.86  0.50 - 1.10 mg/dL   Calcium 8.7  8.4 - 65.710.5 mg/dL   GFR calc non Af Amer 68 (*) >90 mL/min   GFR calc Af Amer 79 (*) >90 mL/min  GLUCOSE, CAPILLARY      Result Value Ref Range   Glucose-Capillary 98  70 - 99 mg/dL   Comment 1 Documented in Chart     Comment 2 Notify RN    GLUCOSE, CAPILLARY      Result Value Ref Range   Glucose-Capillary 108 (*) 70 - 99 mg/dL   Comment 1 Notify RN     Comment 2 Documented in Chart    GLUCOSE, CAPILLARY      Result Value Ref Range   Glucose-Capillary 98  70 - 99 mg/dL   Comment 1 Documented in Chart     Comment 2 Notify RN    GLUCOSE, CAPILLARY      Result Value Ref Range   Glucose-Capillary 90  70 - 99 mg/dL  GLUCOSE, CAPILLARY      Result Value Ref Range   Glucose-Capillary 94  70 - 99 mg/dL   Comment 1 Documented in Chart     Comment 2 Notify RN    GLUCOSE, CAPILLARY      Result Value Ref Range   Glucose-Capillary 97  70 - 99 mg/dL  GLUCOSE, CAPILLARY      Result Value Ref Range   Glucose-Capillary 99  70 - 99 mg/dL  GLUCOSE, CAPILLARY      Result Value Ref Range   Glucose-Capillary 79  70 - 99 mg/dL   Comment 1 Documented in Chart     Comment 2 Notify RN    GLUCOSE, CAPILLARY      Result Value Ref Range   Glucose-Capillary 101 (*) 70 - 99 mg/dL  GLUCOSE, CAPILLARY      Result Value Ref Range   Glucose-Capillary 95  70 - 99 mg/dL   Comment 1 Notify RN     Comment 2 Documented in Chart    GLUCOSE, CAPILLARY      Result  Value Ref Range   Glucose-Capillary 99  70 - 99 mg/dL   Comment 1 Notify RN     Comment 2 Documented in Chart    GLUCOSE, CAPILLARY      Result Value Ref Range   Glucose-Capillary 97  70 - 99 mg/dL   Comment 1 Notify RN     Comment 2 Documented in Chart     Ct Head Wo Contrast  01/03/2014   CLINICAL DATA:  Acute encephalopathy  EXAM: CT HEAD WITHOUT CONTRAST  TECHNIQUE: Contiguous axial images were obtained from the base of the skull through the vertex without intravenous contrast.  COMPARISON:  02/06/2013  FINDINGS: Moderate atrophy. Negative for acute infarct, hemorrhage, or mass lesion. No mass-effect or shift of the midline structures. Calvarium intact.  IMPRESSION: Moderate atrophy.  No acute abnormality.   Electronically Signed   By: Marlan Palauharles  Clark M.D.   On: 01/03/2014 08:22     CRITICAL CARE Performed by: Nelva NayBEATON,Jamirra Curnow L Total critical care time:30 min Critical care time was exclusive of separately billable procedures and treating other patients. Critical care was necessary to treat or prevent imminent or life-threatening deterioration. Critical care was time spent personally by me on the following activities: development of treatment plan with patient and/or surrogate as well as nursing, discussions with consultants, evaluation of patient's response to treatment, examination of patient, obtaining history from patient or surrogate, ordering  and performing treatments and interventions, ordering and review of laboratory studies, ordering and review of radiographic studies, pulse oximetry and re-evaluation of patient's condition.    MDM   Final diagnoses:  Urinary tract infection  Dementia  Hypernatremia  Hyperchloremia  Dehydration   I personally performed the services described in this documentation, which was scribed in my presence. The recorded information has been reviewed and considered.     Nelia Shi, MD 01/11/14 2213

## 2014-01-02 NOTE — Discharge Instructions (Signed)
Urinary Tract Infection °A urinary tract infection (UTI) can occur any place along the urinary tract. The tract includes the kidneys, ureters, bladder, and urethra. A type of germ called bacteria often causes a UTI. UTIs are often helped with antibiotic medicine.  °HOME CARE  °· If given, take antibiotics as told by your doctor. Finish them even if you start to feel better. °· Drink enough fluids to keep your pee (urine) clear or pale yellow. °· Avoid tea, drinks with caffeine, and bubbly (carbonated) drinks. °· Pee often. Avoid holding your pee in for a long time. °· Pee before and after having sex (intercourse). °· Wipe from front to back after you poop (bowel movement) if you are a woman. Use each tissue only once. °GET HELP RIGHT AWAY IF:  °· You have back pain. °· You have lower belly (abdominal) pain. °· You have chills. °· You feel sick to your stomach (nauseous). °· You throw up (vomit). °· Your burning or discomfort with peeing does not go away. °· You have a fever. °· Your symptoms are not better in 3 days. °MAKE SURE YOU:  °· Understand these instructions. °· Will watch your condition. °· Will get help right away if you are not doing well or get worse. °Document Released: 04/12/2008 Document Revised: 07/19/2012 Document Reviewed: 05/25/2012 °ExitCare® Patient Information ©2014 [employer]. ° °

## 2014-01-03 ENCOUNTER — Inpatient Hospital Stay (HOSPITAL_COMMUNITY): Payer: Medicare Other

## 2014-01-03 ENCOUNTER — Encounter (HOSPITAL_COMMUNITY): Payer: Self-pay | Admitting: Internal Medicine

## 2014-01-03 DIAGNOSIS — E870 Hyperosmolality and hypernatremia: Secondary | ICD-10-CM | POA: Diagnosis present

## 2014-01-03 DIAGNOSIS — N390 Urinary tract infection, site not specified: Principal | ICD-10-CM

## 2014-01-03 DIAGNOSIS — G9340 Encephalopathy, unspecified: Secondary | ICD-10-CM

## 2014-01-03 DIAGNOSIS — F0390 Unspecified dementia without behavioral disturbance: Secondary | ICD-10-CM

## 2014-01-03 LAB — CBC WITH DIFFERENTIAL/PLATELET
BASOS PCT: 0 % (ref 0–1)
Basophils Absolute: 0 10*3/uL (ref 0.0–0.1)
EOS ABS: 0.4 10*3/uL (ref 0.0–0.7)
EOS PCT: 6 % — AB (ref 0–5)
HEMATOCRIT: 41.7 % (ref 36.0–46.0)
HEMOGLOBIN: 13.7 g/dL (ref 12.0–15.0)
Lymphocytes Relative: 36 % (ref 12–46)
Lymphs Abs: 2.2 10*3/uL (ref 0.7–4.0)
MCH: 30 pg (ref 26.0–34.0)
MCHC: 32.9 g/dL (ref 30.0–36.0)
MCV: 91.4 fL (ref 78.0–100.0)
MONO ABS: 0.5 10*3/uL (ref 0.1–1.0)
Monocytes Relative: 8 % (ref 3–12)
Neutro Abs: 3.1 10*3/uL (ref 1.7–7.7)
Neutrophils Relative %: 50 % (ref 43–77)
Platelets: 132 10*3/uL — ABNORMAL LOW (ref 150–400)
RBC: 4.56 MIL/uL (ref 3.87–5.11)
RDW: 14.1 % (ref 11.5–15.5)
WBC: 6.2 10*3/uL (ref 4.0–10.5)

## 2014-01-03 LAB — GLUCOSE, CAPILLARY
GLUCOSE-CAPILLARY: 100 mg/dL — AB (ref 70–99)
Glucose-Capillary: 84 mg/dL (ref 70–99)
Glucose-Capillary: 112 mg/dL — ABNORMAL HIGH (ref 70–99)
Glucose-Capillary: 102 mg/dL — ABNORMAL HIGH (ref 70–99)

## 2014-01-03 LAB — COMPREHENSIVE METABOLIC PANEL
ALBUMIN: 3.4 g/dL — AB (ref 3.5–5.2)
ALT: 145 U/L — AB (ref 0–35)
AST: 70 U/L — AB (ref 0–37)
Alkaline Phosphatase: 64 U/L (ref 39–117)
BUN: 28 mg/dL — ABNORMAL HIGH (ref 6–23)
CALCIUM: 9.6 mg/dL (ref 8.4–10.5)
CO2: 22 mEq/L (ref 19–32)
CREATININE: 0.97 mg/dL (ref 0.50–1.10)
Chloride: 118 mEq/L — ABNORMAL HIGH (ref 96–112)
GFR calc Af Amer: 68 mL/min — ABNORMAL LOW (ref >90)
GFR calc non Af Amer: 59 mL/min — ABNORMAL LOW (ref >90)
Glucose, Bld: 103 mg/dL — ABNORMAL HIGH (ref 70–99)
Potassium: 3.7 mEq/L (ref 3.7–5.3)
Sodium: 154 mEq/L — ABNORMAL HIGH (ref 137–147)
TOTAL PROTEIN: 7 g/dL (ref 6.0–8.3)
Total Bilirubin: 0.4 mg/dL (ref 0.3–1.2)

## 2014-01-03 LAB — AMMONIA: AMMONIA: 41 umol/L (ref 11–60)

## 2014-01-03 LAB — TSH: TSH: 0.076 u[IU]/mL — ABNORMAL LOW (ref 0.350–4.500)

## 2014-01-03 MED ORDER — LEVOTHYROXINE SODIUM 100 MCG PO TABS
100.0000 ug | ORAL_TABLET | Freq: Every day | ORAL | Status: DC
  Administered 2014-01-03 – 2014-01-04 (×2): 100 ug via ORAL
  Filled 2014-01-03 (×3): qty 1

## 2014-01-03 MED ORDER — ONDANSETRON HCL 4 MG PO TABS: 4.0000 mg | ORAL_TABLET | Freq: Four times a day (QID) | ORAL | Status: DC | PRN

## 2014-01-03 MED ORDER — CLONAZEPAM 0.5 MG PO TABS: 0.5000 mg | ORAL_TABLET | Freq: Three times a day (TID) | ORAL | Status: DC | PRN

## 2014-01-03 MED ORDER — ATORVASTATIN CALCIUM 40 MG PO TABS
40.0000 mg | ORAL_TABLET | Freq: Every day | ORAL | Status: DC
  Filled 2014-01-03: qty 1

## 2014-01-03 MED ORDER — ACETAMINOPHEN 325 MG PO TABS: 650.0000 mg | ORAL_TABLET | Freq: Four times a day (QID) | ORAL | Status: DC | PRN

## 2014-01-03 MED ORDER — DEXTROSE 5 % IV SOLN: INTRAVENOUS | Status: AC

## 2014-01-03 MED ORDER — DEXTROSE 5 % IV SOLN
INTRAVENOUS | Status: DC
  Administered 2014-01-03: 01:00:00 via INTRAVENOUS

## 2014-01-03 MED ORDER — MEMANTINE HCL 10 MG PO TABS
10.0000 mg | ORAL_TABLET | Freq: Two times a day (BID) | ORAL | Status: DC
  Administered 2014-01-03: 10 mg via ORAL
  Filled 2014-01-03 (×3): qty 1

## 2014-01-03 MED ORDER — ACETAMINOPHEN 650 MG RE SUPP
650.0000 mg | Freq: Four times a day (QID) | RECTAL | Status: DC | PRN
  Administered 2014-01-07: 650 mg via RECTAL
  Filled 2014-01-03: qty 1

## 2014-01-03 MED ORDER — CELECOXIB 100 MG PO CAPS
100.0000 mg | ORAL_CAPSULE | Freq: Two times a day (BID) | ORAL | Status: DC
  Administered 2014-01-03: 100 mg via ORAL
  Filled 2014-01-03 (×3): qty 1

## 2014-01-03 MED ORDER — ONDANSETRON HCL 4 MG/2ML IJ SOLN: 4.0000 mg | Freq: Four times a day (QID) | INTRAMUSCULAR | Status: DC | PRN

## 2014-01-03 MED ORDER — DONEPEZIL HCL 10 MG PO TABS
10.0000 mg | ORAL_TABLET | Freq: Every day | ORAL | Status: DC
  Filled 2014-01-03 (×2): qty 1

## 2014-01-03 MED ORDER — TRAMADOL HCL 50 MG PO TABS
50.0000 mg | ORAL_TABLET | Freq: Four times a day (QID) | ORAL | Status: DC | PRN
Start: 2014-01-03 — End: 2014-01-03

## 2014-01-03 MED ORDER — DEXTROSE 5 % IV SOLN
1.0000 g | Freq: Every day | INTRAVENOUS | Status: DC
  Administered 2014-01-03 (×2): 1 g via INTRAVENOUS
  Filled 2014-01-03 (×3): qty 10

## 2014-01-03 MED ORDER — RISPERIDONE 0.25 MG PO TABS
0.2500 mg | ORAL_TABLET | Freq: Every day | ORAL | Status: DC
  Filled 2014-01-03 (×2): qty 1

## 2014-01-03 NOTE — H&P (Addendum)
[facility] History and Physical  Erika Warren [facility] EHM:[id number] DOB: 02/04/1946 DOA: 01/02/2014  Referring physician: Patient was transferred from [facility]. PCP: No primary provider on file.   Chief Complaint: Poor oral intake and altered mental status.  History obtained from Warren's caregiver who was at the bedside.  HPI: Erika Warren is a 68 y.o. female history of advanced dementia, hypothyroidism and hyperlipidemia was brought [facility] the ER in [facility] as Warren was not having good oral intake over the last one week. Warren was found increasingly lethargic since yesterday morning. In the ER Warren was found to be hypernatremic and was started on IV fluids. Warren also was found to have UTI for which Warren has been placed on IV antibiotics. On my exam Warren is very minimally responsive. Warren's caregiver states that she has become progressively weak over the last one week and since yesterday morning has had hardly waken up. Warren did not have any nausea vomiting abdominal pain diarrhea chest pain or shortness of breath.   Review of Systems: As presented in the history of presenting illness, rest negative.  Past Medical History  Diagnosis Date  . Hypothyroid   . Allergic rhinitis   . Osteoporosis   . Menopause 2000  . Other and unspecified hyperlipidemia   . Tobacco use disorder   . Rectal polyp 04/2005  . Sigmoid diverticulosis   . Internal hemorrhoids   . Dementia    Past Surgical History  Procedure Laterality Date  . Tonsillectomy and adenoidectomy    . Appendectomy     Social History:  reports that she quit smoking about 6 years ago. She has never used smokeless tobacco. She reports that she does not drink alcohol. Her drug history is not on file. Where does Warren live nursing home. Can Warren participate in ADLs? No.  Allergies  Allergen Reactions  . Lorazepam   . Sulfa Antibiotics     Family History: History  reviewed. No pertinent family history.    Prior to Admission medications   Medication Sig Start Date End Date Taking? Authorizing Provider  aspirin 81 MG tablet Take 81 mg by mouth daily.   Yes Historical Provider, MD  cholecalciferol (VITAMIN D) 1000 UNITS tablet Take 5,000 Units by mouth daily.    Yes Historical Provider, MD  levothyroxine (SYNTHROID, LEVOTHROID) 100 MCG tablet Take 1 tablet (100 mcg total) by mouth daily. 04/28/12  Yes Maurice March, MD  acetaminophen (TYLENOL) 500 MG tablet Take 500 mg by mouth every 6 (six) hours as needed.    Historical Provider, MD  cefUROXime (CEFTIN) 250 MG tablet Take 1 tablet (250 mg total) by mouth 2 (two) times daily. 12/12/12   Celene Kras, MD  celecoxib (CELEBREX) 100 MG capsule Take 100 mg by mouth 2 (two) times daily.    Historical Provider, MD  ciprofloxacin (CIPRO) 500 MG/5ML (10%) suspension Take 5 mLs (500 mg total) by mouth 2 (two) times daily. 01/02/14   Nelia Shi, MD  clonazePAM (KLONOPIN) 0.5 MG tablet Take 0.5 mg by mouth 3 (three) times daily as needed.    Historical Provider, MD  donepezil (ARICEPT) 5 MG tablet Take 10 mg by mouth at bedtime as needed.     Historical Provider, MD  memantine (NAMENDA) 10 MG tablet Take 10 mg by mouth 2 (two) times daily.    Historical Provider, MD  Multiple Vitamin (DAILY VITE) TABS TAKE 1 TABLET BY MOUTH EACH MORNING WITH MEAL. 08/03/12   Heather  Jaquita RectorM Marte, PA-C  NON FORMULARY     Historical Provider, MD  QC LO-DOSE ASPIRIN 81 MG EC tablet TAKE ONE TABLET BY MOUTH ONCE DAILY. 08/03/12   Heather Jaquita RectorM Marte, PA-C  risperiDONE (RISPERDAL) 0.25 MG tablet Take 0.25 mg by mouth daily at 8 pm.    Historical Provider, MD  rosuvastatin (CRESTOR) 20 MG tablet Take 1 tablet (20 mg total) by mouth daily. 04/28/12 04/28/13  Maurice MarchBarbara B McPherson, MD  Skin Protectants, Misc. (DIMETHICONE-ZINC OXIDE) cream PROVIDE AND APPLY TO BUTTOCK TWICE DAILY. 08/01/12   Anders SimmondsAngela M McClung, PA-C  traMADol (ULTRAM) 50 MG tablet Take 50  mg by mouth every 6 (six) hours as needed.    Historical Provider, MD    Physical Exam: Filed Vitals:   01/02/14 2105 01/02/14 2219 01/02/14 2333  BP: 122/100  132/90  Pulse: 86  91  Temp: 99.7 F (37.6 C) 98.6 F (37 C) 97.9 F (36.6 C)  TempSrc: Oral  Axillary  Resp: 18  18  SpO2: 99%  97%     General:  Well developed and moderately nourished.  Eyes: Anicteric no pallor.  ENT: No discharge from the ears eyes nose mouth.  Neck: No mass felt.  Cardiovascular: S1-S2 heard.  Respiratory:  No rhonchi or crepitations.  Abdomen: Soft nontender bowel sounds present. No guarding or rigidity.  Skin: No rash.  Musculoskeletal: No edema.  Psychiatric: Warren is lethargic.  Neurologic: Warren is lethargic.  Labs on Admission:  Basic Metabolic Panel:  Recent Labs Lab 01/02/14 2115  NA 156*  K 3.9  CL 118*  CO2 23  GLUCOSE 108*  BUN 31*  CREATININE 1.10  CALCIUM 10.2   Liver Function Tests: No results found for this basename: AST, ALT, ALKPHOS, BILITOT, PROT, ALBUMIN,  in the last 168 hours No results found for this basename: LIPASE, AMYLASE,  in the last 168 hours No results found for this basename: AMMONIA,  in the last 168 hours CBC:  Recent Labs Lab 01/02/14 2115  WBC 7.8  NEUTROABS 4.2  HGB 14.2  HCT 43.7  MCV 91.6  PLT 144*   Cardiac Enzymes: No results found for this basename: CKTOTAL, CKMB, CKMBINDEX, TROPONINI,  in the last 168 hours  BNP (last 3 results) No results found for this basename: PROBNP,  in the last 8760 hours CBG: No results found for this basename: GLUCAP,  in the last 168 hours  Radiological Exams on Admission: No results found.   Assessment/Plan Principal Problem:   Acute encephalopathy Active Problems:   Hypothyroid   Dementia   UTI (lower urinary tract infection)   Hypernatremia   1. Acute encephalopathy most likely secondary to metabolic reasons and UTI - at this time I have continued Warren on ceftriaxone  for UTI. Follow urine cultures. For Warren's hypernatremia I have placed Warren on D5W. Closely follow metabolic panel. Since Warren's mental status is acutely changed I have ordered CT head without contrast. 2. Hypernatremia probably from dehydration and poor oral intake - presently Warren is on D5W and once Warren becomes more alert and awake may need more clear fluid intake. 3. UTI - on ceftriaxone. Check urine culture. 4. Hypothyroidism - continue Synthroid. If Warren is not able to take orally may need to convert Synthroid IV. 5. Hyperlipidemia - continue statins. 6. Dementia - continue present medications.    Code Status: DO NOT RESUSCITATE.   Disposition Plan: Admit to inpatient.    Archit Leger N. [facility] Pager [phone].  If 7PM-7AM, please contact  night-coverage [email or web address] Password [facility] 01/03/2014, 1:41 AM

## 2014-01-03 NOTE — Progress Notes (Signed)
Patient admitted after midnight by Dr. Kirtland Bouchard.  Please see H&P.  Acute encephalopathy most likely secondary to metabolic reasons and UTI  -ceftriaxone -Follow urine cultures.   hypernatremia  -D5W.  - CT head without contrast.  -Hypernatremia probably from dehydration and poor oral intake -   Hypothyroidism - continue Synthroid.   Hyperlipidemia - continue statins.   Dementia - continue present medications  Marlin Canary DO

## 2014-01-03 NOTE — Progress Notes (Signed)
   CARE MANAGEMENT NOTE 01/03/2014  Patient:  Erika Warren, Erika Warren   Account Number:  [id number]  Date Initiated:  01/03/2014  Documentation initiated by:  Jiles Crocker  Subjective/Objective Assessment:   ADMITTED WITH UTI     Action/Plan:   PATIENT RESIDES IN NURSING FACILITY; SW REFERRAL PLACED   Anticipated DC Date:  01/10/2014   Anticipated DC Plan:  SKILLED NURSING FACILITY  In-house referral  Clinical Social Worker      DC Planning Services  CM consult         Status of service:  In process, will continue to follow Medicare Important Message given?  NA - LOS <3 / Initial given by admissions (If response is "NO", the following Medicare IM given date fields will be blank)  Per UR Regulation:  Reviewed for med. necessity/level of care/duration of stay  Comments:  2/26/2015Abelino Derrick RN,BSN,MHA [phone]

## 2014-01-03 NOTE — Progress Notes (Signed)
Report called off from [facility] at 2245 and pt got to the unit at 2320; pt oriented to her room with her personal aid at bedside along with pt belongings; pt non-verbal difficult to assess. Pt IV remains intact, pt restless and eyes closed, wont open them and not following command. Will continue to monitor pt quietly. MD in to assess, personal aide at side and call light with in reach.

## 2014-01-04 DIAGNOSIS — E860 Dehydration: Secondary | ICD-10-CM

## 2014-01-04 DIAGNOSIS — E039 Hypothyroidism, unspecified: Secondary | ICD-10-CM

## 2014-01-04 LAB — URINE CULTURE: SPECIAL REQUESTS: NORMAL

## 2014-01-04 LAB — GLUCOSE, CAPILLARY
GLUCOSE-CAPILLARY: 108 mg/dL — AB (ref 70–99)
Glucose-Capillary: 96 mg/dL (ref 70–99)
Glucose-Capillary: 90 mg/dL (ref 70–99)
Glucose-Capillary: 90 mg/dL (ref 70–99)
Glucose-Capillary: 98 mg/dL (ref 70–99)

## 2014-01-04 MED ORDER — CEFAZOLIN SODIUM 1-5 GM-% IV SOLN
1.0000 g | Freq: Three times a day (TID) | INTRAVENOUS | Status: DC
  Administered 2014-01-04 – 2014-01-05 (×3): 1 g via INTRAVENOUS
  Filled 2014-01-04 (×6): qty 50

## 2014-01-04 MED ORDER — ENSURE COMPLETE PO LIQD
237.0000 mL | ORAL | Status: DC
  Administered 2014-01-04 – 2014-01-06 (×3): 237 mL via ORAL

## 2014-01-04 MED ORDER — LEVOTHYROXINE SODIUM 125 MCG PO TABS
125.0000 ug | ORAL_TABLET | Freq: Every day | ORAL | Status: DC
  Filled 2014-01-04: qty 1

## 2014-01-04 MED ORDER — ASPIRIN 81 MG PO CHEW
81.0000 mg | CHEWABLE_TABLET | Freq: Every day | ORAL | Status: DC
  Administered 2014-01-04 – 2014-01-07 (×4): 81 mg via ORAL
  Filled 2014-01-04 (×4): qty 1

## 2014-01-04 MED ORDER — BOOST / RESOURCE BREEZE PO LIQD
1.0000 | ORAL | Status: DC
  Administered 2014-01-05: 1 via ORAL

## 2014-01-04 MED ORDER — POTASSIUM CL IN DEXTROSE 5% 20 MEQ/L IV SOLN
20.0000 meq | INTRAVENOUS | Status: DC
  Administered 2014-01-04 – 2014-01-05 (×2): 20 meq via INTRAVENOUS
  Filled 2014-01-04 (×4): qty 1000

## 2014-01-04 MED ORDER — LEVOTHYROXINE SODIUM 100 MCG PO TABS
100.0000 ug | ORAL_TABLET | Freq: Every day | ORAL | Status: DC
  Administered 2014-01-05 – 2014-01-07 (×3): 100 ug via ORAL
  Filled 2014-01-04 (×4): qty 1

## 2014-01-04 NOTE — Clinical Social Work Psychosocial (Signed)
Clinical Social Work Department BRIEF PSYCHOSOCIAL ASSESSMENT 01/04/2014  Patient:  Erika Warren, Erika Warren     Account Number:  [id number]     Admit date:  01/02/2014  Clinical Social Worker:  Donna Christen  Date/Time:  01/04/2014 02:32 PM  Referred by:  Physician  Date Referred:  01/04/2014 Referred for  Other - See comment   Other Referral:   Returning to ALF [facility]).   Interview type:  Family Other interview type:   CSW met with pt's son Erika Warren) at bedside.    PSYCHOSOCIAL DATA Living Status:  FACILITY Admitted from facility:  [facility] Level of care:  Assisted Living Primary support name:  Erika Warren Primary support relationship to patient:  CHILD, ADULT Degree of support available:   Strong support system. Pt's son is currently paying $1600/month for pt to receive round-clock care.    CURRENT CONCERNS Current Concerns  Post-Acute Placement   Other Concerns:   none.    SOCIAL WORK ASSESSMENT / PLAN CSW met with pt's son in conference area on 4N to discuss pt's son concerns with discharge disposition. Per pt's son, he was informed by RN that pt would be discharged to SNF and he wanted CSW to know that family does NOT want pt to be placed in SNF. CSW offered support to pt's son and apologized for confusion. Pt's son reported that pt has been living at [facility] and really enjoys [facility]. Pt's son stated that family pays for pt to receive 24/7 private care while at [facility] as she is unable to manage on her own. CSW assured pt's son that [facility] and CSW have already spoken and that pt will be returning to [facility] upon discharge from [facility]. CSW will continue to follow and assist with discharge planning needs.   Assessment/plan status:  Psychosocial Support/Ongoing Assessment of Needs Other assessment/ plan:   none.   Information/referral to community resources:   none, pt returning to [facility].     PATIENTS/FAMILYS RESPONSE TO PLAN OF CARE: Pt's son was very agreeable to CSW plan of care after discussing that pt will not be placed in SNF. Pt's son very pleasant to work with and very informed about pt's care and well-being.       Pati Gallo, [place] Social Worker [phone]

## 2014-01-04 NOTE — Progress Notes (Addendum)
[facility] PROGRESS NOTE  [facility] ZOX:[id number] DOB: Sep 02, 1946 DOA: 01/02/2014 PCP: Pcp Not In System  Assessment/Plan:  Principal Problem:   Acute encephalopathy: per caregiver, improved. Active Problems:   Hypothyroid: TSH low. Synthroid adjusted   Dementia, end stage. Per son, family interested in palliative care consult, may consider comfort care after this admission.  She has declined, poor QOL and recurrent infections/dehydration cycle.   UTI (lower urinary tract infection), EColi. Pansensitive.  Change to ancef   Hypernatremia increase free water.   Code Status:  DNR Family Communication:  Son via phone Disposition Plan:  Back to ALF when stable  Consultants:  PCT  Procedures:     Antibiotics:  Ceftriaxone 2/26 - 2/27  Cefazolin 2/27 -  HPI/Subjective:   Objective: Filed Vitals:   01/04/14 1412  BP: 120/77  Pulse: 76  Temp: 97.3 F (36.3 C)  Resp: 18    Intake/Output Summary (Last 24 hours) at 01/04/14 1600 Last data filed at 01/04/14 1200  Gross per 24 hour  Intake    120 ml  Output      0 ml  Net    120 ml   Filed Weights   01/03/14 0748 01/04/14 0500  Weight: 53.2 kg (117 lb 4.6 oz) 55.067 kg (121 lb 6.4 oz)    Exam:   General:  Asleep. Arousable. Nonverbal. Does not follow commands  Cardiovascular: RRR without MGR  Respiratory: CTA without WRR  Abdomen: s, nt, nd  Ext: no CCE  Neuro: nonfocal  Basic Metabolic Panel:  Recent Labs Lab 01/02/14 2115 01/03/14 0403  NA 156* 154*  K 3.9 3.7  CL 118* 118*  CO2 23 22  GLUCOSE 108* 103*  BUN 31* 28*  CREATININE 1.10 0.97  CALCIUM 10.2 9.6   Liver Function Tests:  Recent Labs Lab 01/03/14 0403  AST 70*  ALT 145*  ALKPHOS 64  BILITOT 0.4  PROT 7.0  ALBUMIN 3.4*   No results found for this basename: LIPASE, AMYLASE,  in the last 168 hours  Recent Labs Lab 01/03/14 0403  AMMONIA 41   CBC:  Recent Labs Lab 01/02/14 2115 01/03/14 0403   WBC 7.8 6.2  NEUTROABS 4.2 3.1  HGB 14.2 13.7  HCT 43.7 41.7  MCV 91.6 91.4  PLT 144* 132*   Cardiac Enzymes: No results found for this basename: CKTOTAL, CKMB, CKMBINDEX, TROPONINI,  in the last 168 hours BNP (last 3 results) No results found for this basename: PROBNP,  in the last 8760 hours CBG:  Recent Labs Lab 01/03/14 1150 01/03/14 1635 01/03/14 2152 01/04/14 0638 01/04/14 1206  GLUCAP 84 112* 102* 96 90    Recent Results (from the past 240 hour(s))  URINE CULTURE     Status: None   Collection Time    01/02/14  8:57 PM      Result Value Ref Range Status   Specimen Description URINE, CATHETERIZED   Final   Special Requests Normal   Final   Culture  Setup Time     Final   Value: 01/03/2014 01:27     Performed at [facility]   Colony Count     Final   Value: >=100,000 COLONIES/ML     Performed at [facility]   Culture     Final   Value: ESCHERICHIA COLI     Performed at [facility]   Report Status 01/04/2014 FINAL   Final   Organism ID, Bacteria ESCHERICHIA COLI   Final  Studies: Ct Head Wo Contrast  01/03/2014   CLINICAL DATA:  Acute encephalopathy  EXAM: CT HEAD WITHOUT CONTRAST  TECHNIQUE: Contiguous axial images were obtained from the base of the skull through the vertex without intravenous contrast.  COMPARISON:  02/06/2013  FINDINGS: Moderate atrophy. Negative for acute infarct, hemorrhage, or mass lesion. No mass-effect or shift of the midline structures. Calvarium intact.  IMPRESSION: Moderate atrophy.  No acute abnormality.   Electronically Signed   By: Marlan Palau M.D.   On: 01/03/2014 08:22    Scheduled Meds: .  ceFAZolin (ANCEF) IV  1 g Intravenous 3 times per day  . feeding supplement (ENSURE COMPLETE)  237 mL Oral Q24H  . [START ON 01/05/2014] feeding supplement (RESOURCE BREEZE)  1 Container Oral Q24H  . levothyroxine  100 mcg Oral QAC breakfast   Continuous Infusions: . dextrose 5 % with KCl 20 mEq / L       Time spent: 35 minutes  Daevon Holdren L  [facility] Pager [phone]. If 7PM-7AM, please contact night-coverage at [email or web address], password [facility] 01/04/2014, 4:00 PM  LOS: 2 days

## 2014-01-04 NOTE — Progress Notes (Signed)
INITIAL NUTRITION ASSESSMENT  DOCUMENTATION CODES Per approved criteria  -Not Applicable   INTERVENTION: Provide Ensure Complete, Resource Breeze, and [employer] once daily each Recommend SLP evaluation (family reports pocketing of food and drooling of PO liquids) Recommend adding stool softener or laxative (Last BM 2/22)   NUTRITION DIAGNOSIS: Inadequate oral intake related to acute encephalopathy as evidenced by 20% meal completion and 10% weight loss in less than 3 months.    Goal: Pt to meet >/= 90% of their estimated nutrition needs   Monitor:  PO intake, mental status, weight trends, labs  Reason for Assessment: Low Braden  68 y.o. female  Admitting Dx: Acute encephalopathy  ASSESSMENT: 68 y.o. female history of advanced dementia, hypothyroidism and hyperlipidemia was [facility] the [facility] as patient was not having good oral intake over the last one week. Patient was found increasingly lethargic since yesterday morning. In the ER patient was found to be hypernatremic and was started on IV fluids. Patient also was found to have UTI for which patient has been placed on IV antibiotics.  Pt asleep at time of visit. Per family, since Wednesday pt has not been eating much at all due to sleeping too much. Family reports that pt used to weigh 130 lbs but, has slowly been losing weight since the beginning of December. Pt weighed 117 lbs just PTA per family- 10% weight loss in the past 3 months. Pt usually has a good appetite and eats 3 meals daily with 75-100% meal completion. Family also report that pt started pocketing food and drooling out po liquids as of last Friday. Per family pt has been constipated; last BM documented was 2/22.   Suspect some form of malnutrition but, unable to to confirm at this time. Will attempt nutrition-related physical exam at follow-up.   Height: Ht Readings from Last 1 Encounters:  01/03/14 5\' 4"  (1.626 m)    Weight: Wt Readings  from Last 1 Encounters:  01/04/14 121 lb 6.4 oz (55.067 kg)    Ideal Body Weight: 120 lbs  % Ideal Body Weight: 100%  Wt Readings from Last 10 Encounters:  01/04/14 121 lb 6.4 oz (55.067 kg)  06/08/12 128 lb (58.06 kg)  04/28/12 125 lb (56.7 kg)  12/30/11 119 lb 9.6 oz (54.25 kg)    Usual Body Weight: 130 lbs  % Usual Body Weight: 93%  BMI:  Body mass index is 20.83 kg/(m^2).  Estimated Nutritional Needs: Kcal: 1300-1550 Protein: 65-75 grams Fluid: 1.5 L/day  Skin: intact  Diet Order: General  EDUCATION NEEDS: -No education needs identified at this time   Intake/Output Summary (Last 24 hours) at 01/04/14 1153 Last data filed at 01/03/14 1700  Gross per 24 hour  Intake      0 ml  Output      0 ml  Net      0 ml    Last BM: 2/22   Labs:   Recent Labs Lab 01/02/14 2115 01/03/14 0403  NA 156* 154*  K 3.9 3.7  CL 118* 118*  CO2 23 22  BUN 31* 28*  CREATININE 1.10 0.97  CALCIUM 10.2 9.6  GLUCOSE 108* 103*    CBG (last 3)   Recent Labs  01/03/14 1635 01/03/14 2152 01/04/14 0638  GLUCAP 112* 102* 96    Scheduled Meds: . cefTRIAXone (ROCEPHIN)  IV  1 g Intravenous QHS  . levothyroxine  100 mcg Oral QAC breakfast    Continuous Infusions:   Past Medical  History  Diagnosis Date  . Hypothyroid   . Allergic rhinitis   . Osteoporosis   . Menopause 2000  . Other and unspecified hyperlipidemia   . Tobacco use disorder   . Rectal polyp 04/2005  . Sigmoid diverticulosis   . Internal hemorrhoids   . Dementia     Past Surgical History  Procedure Laterality Date  . Tonsillectomy and adenoidectomy    . Appendectomy      Ian Malkineanne Barnett RD, LDN Inpatient Clinical Dietitian Pager: [phone] After Hours Pager: [phone]

## 2014-01-05 LAB — GLUCOSE, CAPILLARY
GLUCOSE-CAPILLARY: 94 mg/dL (ref 70–99)
GLUCOSE-CAPILLARY: 97 mg/dL (ref 70–99)
Glucose-Capillary: 98 mg/dL (ref 70–99)
Glucose-Capillary: 90 mg/dL (ref 70–99)

## 2014-01-05 LAB — BASIC METABOLIC PANEL
BUN: 15 mg/dL (ref 6–23)
CHLORIDE: 106 meq/L (ref 96–112)
CO2: 22 meq/L (ref 19–32)
CREATININE: 0.86 mg/dL (ref 0.50–1.10)
Calcium: 8.7 mg/dL (ref 8.4–10.5)
GFR calc non Af Amer: 68 mL/min — ABNORMAL LOW (ref >90)
GFR, EST AFRICAN AMERICAN: 79 mL/min — AB (ref >90)
Glucose, Bld: 105 mg/dL — ABNORMAL HIGH (ref 70–99)
Potassium: 3.6 mEq/L — ABNORMAL LOW (ref 3.7–5.3)
Sodium: 140 mEq/L (ref 137–147)

## 2014-01-05 MED ORDER — BOOST / RESOURCE BREEZE PO LIQD
1.0000 | Freq: Three times a day (TID) | ORAL | Status: DC
  Administered 2014-01-05 – 2014-01-06 (×3): 1 via ORAL

## 2014-01-05 MED ORDER — CEPHALEXIN 500 MG PO CAPS
500.0000 mg | ORAL_CAPSULE | Freq: Two times a day (BID) | ORAL | Status: DC
  Administered 2014-01-05 – 2014-01-07 (×5): 500 mg via ORAL
  Filled 2014-01-05 (×6): qty 1

## 2014-01-05 MED ORDER — POTASSIUM CHLORIDE 10 MEQ/100ML IV SOLN
10.0000 meq | INTRAVENOUS | Status: AC
  Administered 2014-01-05 (×2): 10 meq via INTRAVENOUS
  Filled 2014-01-05 (×2): qty 100

## 2014-01-05 NOTE — Progress Notes (Signed)
[facility] PROGRESS NOTE  [facility]ight[facility]ricia [facility] [id number] DOB: Apr 12, 1946 DOA: 01/02/2014 PCP: Pcp Not In System  Assessment/Plan:  Principal Problem:   Acute encephalopathy: per caregiver, improved. Active Problems:   Hypothyroid: TSH low. Synthroid adjusted   Dementia, end stage. Palliative consult appreciated. To ALF with hospice at discharge.   UTI (lower urinary tract infection), EColi. Pansensitive.  Change to keflex   Hypernatremia resolved.   Code Status:  DNR Family Communication:  Daughter at bedside Disposition Plan:  Back to ALF when stable  Consultants:  PCT  Procedures:     Antibiotics:  Ceftriaxone 2/26 - 2/27  Cefazolin 2/27 - 2/28  Cephalexin 2/28  HPI/Subjective: Unable. Per tech, eating, good UOP  Objective: Filed Vitals:   01/05/14 0800  BP: 92/69  Pulse: 105  Temp: 97.5 F (36.4 C)  Resp: 17   No intake or output data in the 24 hours ending 01/05/14 1313 Filed Weights   01/03/14 0748 01/04/14 0500  Weight: 53.2 kg (117 lb 4.6 oz) 55.067 kg (121 lb 6.4 oz)    Exam:   General:  Awake. Moans unintelligibly  Cardiovascular: RRR without MGR  Respiratory: CTA without WRR  Abdomen: s, nt, nd  Ext: no CCE  Neuro: nonfocal  Basic Metabolic Panel:  Recent Labs Lab 01/02/14 2115 01/03/14 0403 01/05/14 0455  NA 156* 154* 140  K 3.9 3.7 3.6*  CL 118* 118* 106  CO2 23 22 22   GLUCOSE 108* 103* 105*  BUN 31* 28* 15  CREATININE 1.10 0.97 0.86  CALCIUM 10.2 9.6 8.7   Liver Function Tests:  Recent Labs Lab 01/03/14 0403  AST 70*  ALT 145*  ALKPHOS 64  BILITOT 0.4  PROT 7.0  ALBUMIN 3.4*   No results found for this basename: LIPASE, AMYLASE,  in the last 168 hours  Recent Labs Lab 01/03/14 0403  AMMONIA 41   CBC:  Recent Labs Lab 01/02/14 2115 01/03/14 0403  WBC 7.8 6.2  NEUTROABS 4.2 3.1  HGB 14.2 13.7  HCT 43.7 41.7  MCV 91.6 91.4  PLT 144* 132*   Cardiac Enzymes: No results found for  this basename: CKTOTAL, CKMB, CKMBINDEX, TROPONINI,  in the last 168 hours BNP (last 3 results) No results found for this basename: PROBNP,  in the last 8760 hours CBG:  Recent Labs Lab 01/04/14 2017 01/04/14 2342 01/05/14 0406 01/05/14 0904 01/05/14 1144  GLUCAP 98 108* 98 90 94    Recent Results (from the past 240 hour(s))  URINE CULTURE     Status: None   Collection Time    01/02/14  8:57 PM      Result Value Ref Range Status   Specimen Description URINE, CATHETERIZED   Final   Special Requests Normal   Final   Culture  Setup Time     Final   Value: 01/03/2014 01:27     Performed at [facility]   Colony Count     Final   Value: >=100,000 COLONIES/ML     Performed at [facility]   Culture     Final   Value: ESCHERICHIA COLI     Performed at [facility]   Report Status 01/04/2014 FINAL   Final   Organism ID, Bacteria ESCHERICHIA COLI   Final     Studies: No results found.  Scheduled Meds: . aspirin  81 mg Oral Daily  .  ceFAZolin (ANCEF) IV  1 g Intravenous 3 times per day  . feeding supplement (ENSURE  COMPLETE)  237 mL Oral Q24H  . feeding supplement (RESOURCE BREEZE)  1 Container Oral Q24H  . levothyroxine  100 mcg Oral QAC breakfast   Continuous Infusions: . dextrose 5 % with KCl 20 mEq / L 20 mEq (01/05/14 0311)    Time spent: 25 minutes  [place] Rommel L  [facility] Pager [phone]. If 7PM-7AM, please contact night-coverage at [email or web address], password [facility] 01/05/2014, 1:13 PM  LOS: 3 days

## 2014-01-05 NOTE — Progress Notes (Signed)
Palliative Medicine consult received-I spoke with patient's son Onalee Hua, message left for [employer] (he is in a class today) regarding possible options for their mother based on needs documented in the chart. I am recommending that upon discharge a referral be made to Hospice of their choice for ongoing care at [facility] is in ALF with added support of paid caregivers-Hospice would add an additional layer of support. Plan is to treat conservatively during this hospitalization with IV abx, with next steps being comfort care and goal to avoid readmission. She is Hospice referral appropriate. Will await return call from [name] to further discuss options.  Anderson Malta, DO Palliative Medicine

## 2014-01-06 LAB — GLUCOSE, CAPILLARY
GLUCOSE-CAPILLARY: 99 mg/dL (ref 70–99)
GLUCOSE-CAPILLARY: 99 mg/dL (ref 70–99)
Glucose-Capillary: 79 mg/dL (ref 70–99)
Glucose-Capillary: 101 mg/dL — ABNORMAL HIGH (ref 70–99)
Glucose-Capillary: 95 mg/dL (ref 70–99)
Glucose-Capillary: 97 mg/dL (ref 70–99)

## 2014-01-06 NOTE — Progress Notes (Signed)
[facility] PROGRESS NOTE  Allona Kintigh [facility] [id number] DOB: 09/24/1946 DOA: 01/02/2014 PCP: Pcp Not In System  Assessment/Plan:  Principal Problem:   Acute encephalopathy: at baseline? Active Problems:   Hypothyroid: TSH low. Synthroid adjusted   Dementia, end stage. Palliative consult appreciated. To ALF monday with hospice.   UTI (lower urinary tract infection), EColi. Pansensitive.   keflex   Hypernatremia resolved.   Code Status:  DNR Family Communication:  Daughter at bedside Disposition Plan:  Back to ALF when stable  Consultants:  PCT  Procedures:     Antibiotics:  Ceftriaxone 2/26 - 2/27  Cefazolin 2/27 - 2/28  Cephalexin 2/28  HPI/Subjective: Unable. Per tech, eating, good UOP  Objective: Filed Vitals:   01/06/14 1811  BP: 131/75  Pulse: 105  Temp: 97.4 F (36.3 C)  Resp: 18   No intake or output data in the 24 hours ending 01/06/14 2223 Filed Weights   01/03/14 0748 01/04/14 0500  Weight: 53.2 kg (117 lb 4.6 oz) 55.067 kg (121 lb 6.4 oz)    Exam:   General:  Awake. Fidgety. nonverbal  Cardiovascular: RRR without MGR  Respiratory: CTA without WRR  Abdomen: s, nt, nd  Ext: no CCE  Neuro: nonfocal  Basic Metabolic Panel:  Recent Labs Lab 01/02/14 2115 01/03/14 0403 01/05/14 0455  NA 156* 154* 140  K 3.9 3.7 3.6*  CL 118* 118* 106  CO2 23 22 22   GLUCOSE 108* 103* 105*  BUN 31* 28* 15  CREATININE 1.10 0.97 0.86  CALCIUM 10.2 9.6 8.7   Liver Function Tests:  Recent Labs Lab 01/03/14 0403  AST 70*  ALT 145*  ALKPHOS 64  BILITOT 0.4  PROT 7.0  ALBUMIN 3.4*   No results found for this basename: LIPASE, AMYLASE,  in the last 168 hours  Recent Labs Lab 01/03/14 0403  AMMONIA 41   CBC:  Recent Labs Lab 01/02/14 2115 01/03/14 0403  WBC 7.8 6.2  NEUTROABS 4.2 3.1  HGB 14.2 13.7  HCT 43.7 41.7  MCV 91.6 91.4  PLT 144* 132*   Cardiac Enzymes: No results found for this basename: CKTOTAL, CKMB,  CKMBINDEX, TROPONINI,  in the last 168 hours BNP (last 3 results) No results found for this basename: PROBNP,  in the last 8760 hours CBG:  Recent Labs Lab 01/06/14 0432 01/06/14 0546 01/06/14 1036 01/06/14 1437 01/06/14 1651  GLUCAP 79 101* 95 99 97    Recent Results (from the past 240 hour(s))  URINE CULTURE     Status: None   Collection Time    01/02/14  8:57 PM      Result Value Ref Range Status   Specimen Description URINE, CATHETERIZED   Final   Special Requests Normal   Final   Culture  Setup Time     Final   Value: 01/03/2014 01:27     Performed at [facility] Count     Final   Value: >=100,000 COLONIES/ML     Performed at [facility]   Culture     Final   Value: ESCHERICHIA COLI     Performed at [facility]   Report Status 01/04/2014 FINAL   Final   Organism ID, Bacteria ESCHERICHIA COLI   Final     Studies: No results found.  Scheduled Meds: . aspirin  81 mg Oral Daily  . cephALEXin  500 mg Oral Q12H  . feeding supplement (ENSURE COMPLETE)  237 mL Oral Q24H  . feeding  supplement (RESOURCE BREEZE)  1 Container Oral TID BM  . levothyroxine  100 mcg Oral QAC breakfast   Continuous Infusions:    Time spent: 15 minutes  Bracen Schum L  [facility] Pager [phone]. If 7PM-7AM, please contact night-coverage at [email or web address], password [email or web address] 01/06/2014, 10:23 PM  LOS: 4 days

## 2014-01-06 NOTE — Progress Notes (Signed)
Assumed patient care from [place], [place].  Patient has personal care sitter at bedside; rendering hands on care.  No s/s of distress noted with patient at this time.  Instructed sitter to contact nurse should any change in patient occur.

## 2014-01-06 NOTE — Progress Notes (Signed)
Gave patient 118 ml's of orange juice for CBG of 79, will continue to monitor

## 2014-01-06 NOTE — Plan of Care (Signed)
Problem: Phase I Progression Outcomes Goal: Voiding-avoid urinary catheter unless indicated Outcome: Not Applicable Date Met:  58/34/62 Patient is incontinent of bowel and bladder.

## 2014-01-06 NOTE — Plan of Care (Signed)
Problem: Phase II Progression Outcomes Goal: Discharge plan established Outcome: Completed/Met Date Met:  01/06/14 Patient is to return to home with family.

## 2014-01-07 MED ORDER — LEVOTHYROXINE SODIUM 100 MCG PO TABS: 100.0000 ug | ORAL_TABLET | Freq: Every day | ORAL | Status: AC

## 2014-01-07 MED ORDER — CEPHALEXIN 500 MG PO CAPS: 500.0000 mg | ORAL_CAPSULE | Freq: Two times a day (BID) | ORAL | Status: DC

## 2014-01-07 NOTE — Progress Notes (Signed)
Report given to [place], from [facility].

## 2014-01-07 NOTE — Discharge Summary (Signed)
Physician Discharge Summary  Erika Warren [facility] ZOX:[id number] DOB: [birth year] DOA: 01/02/2014  PCP: Pcp Not In System  Admit date: 01/02/2014 Discharge date: 01/07/2014  Time spent: greater than 30 minutes  Recommendations for Outpatient Follow-up:  1. Back to ALF with hospice for end of life care  Discharge Diagnoses:  Principal Problem:   Acute encephalopathy Active Problems:   Hypothyroid   Dementia   UTI (lower urinary tract infection), e coli, pansensitive   Hypernatremia Dehydration  Discharge Condition: stable  Code status:  DNR/comfort care  [facility] Weights   01/03/14 0748 01/04/14 0500  Weight: 53.2 kg (117 lb 4.6 oz) 55.067 kg (121 lb 6.4 oz)    History of present illness:  68 y.o. female history of advanced dementia, hypothyroidism and hyperlipidemia was brought to the ER in [facility] as patient was not having good oral intake over the last one week. Patient was found increasingly lethargic since yesterday morning. In the ER patient was found to be hypernatremic and was started on IV fluids. Patient also was found to have UTI for which patient has been placed on IV antibiotics. On my exam patient is very minimally responsive. Patient's caregiver states that she has become progressively weak over the last one week and since yesterday morning has had hardly waken up. Patient did not have any nausea vomiting abdominal pain diarrhea chest pain or shortness of breath. Found to have hypernatremia and UTI  Hospital Course:  Admitted to hospitalists, started on D5W, Rocephin. Her level of alertness improved. By discharge she is euvolemic. Urine culture showed Escherichia coli which is pansensitive. She was tolerating a diet. Transition to oral antibiotics. Family requested a palliative care consult. The decision to make patient comfort measures and have hospice follow at assisted living was made. Arrangements have been made. While here, she has been minimally interactive.  She does not follow commands, make eye contact, or speak intelligibly. 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  TSH was low and her Synthroid dose has been decreased from 125 mcg a day to 100 mcg a day.  Procedures:  none  Consultations:  Palliative care team  Discharge Exam: Filed Vitals:   01/07/14 0958  BP: 144/85  Pulse: 102  Temp: 97.2 F (36.2 C)  Resp: 18    General: Eyes closed. fidjety HEENT: Moist mucous membranes Cardiovascular: Regular rate rhythm Respiratory: Clear to auscultation bilaterally without wheezes rhonchi or rales Abdomen soft nontender nondistended Extremities no clubbing cyanosis or edema  Discharge Instructions  Discharge Orders   Future Orders Complete By Expires   Diet general  As directed    Increase activity slowly  As directed    Walk with assistance  As directed        Medication List         acetaminophen 325 MG tablet  Commonly known as:  TYLENOL  Take 650 mg by mouth 3 (three) times daily with meals.     acetaminophen 325 MG tablet  Commonly known as:  TYLENOL  Take 650 mg by mouth every 6 (six) hours as needed for fever.     aspirin 81 MG chewable tablet  Chew 81 mg by mouth daily.     calcium carbonate 500 MG chewable tablet  Commonly known as:  TUMS - dosed in mg elemental calcium  Chew 2 tablets by mouth 3 (three) times daily with meals.     calcium carbonate 500 MG chewable tablet  Commonly known  as:  TUMS - dosed in mg elemental calcium  Chew 1,000 mg by mouth every 4 (four) hours as needed for indigestion or heartburn.     cephALEXin 500 MG capsule  Commonly known as:  KEFLEX  Take 1 capsule (500 mg total) by mouth every 12 (twelve) hours. For 3 doses     Cranberry Fruit 475 MG Caps  Take 475 mg by mouth 2 (two) times daily. May open and mix with food     guaiFENesin 100 MG/5ML Soln  Commonly known as:  ROBITUSSIN  Take 10 mLs by mouth every 4 (four) hours as needed for  cough or to loosen phlegm.     HYDROcodone-acetaminophen 5-325 MG per tablet  Commonly known as:  NORCO/VICODIN  Take 1 tablet by mouth every 6 (six) hours as needed for moderate pain.     ibuprofen 100 MG/5ML suspension  Commonly known as:  ADVIL,MOTRIN  Take 400 mg by mouth every 6 (six) hours as needed for mild pain.     levothyroxine 100 MCG tablet  Commonly known as:  SYNTHROID, LEVOTHROID  Take 1 tablet (100 mcg total) by mouth daily before breakfast.     loratadine 10 MG tablet  Commonly known as:  CLARITIN  Take 10 mg by mouth daily as needed for allergies.     Melatonin 5 MG Tabs  Take 5 mg by mouth at bedtime as needed (sleep).     memantine 10 MG tablet  Commonly known as:  NAMENDA  Take 10 mg by mouth 2 (two) times daily.     MULTIVITAMIN GUMMIES ADULT PO  Take 1 each by mouth daily.     POLYETHYLENE GLYCOL 3350 PO  Take 0.5 each by mouth daily. Patient takes a 1/2 capful in juice of choice     rosuvastatin 20 MG tablet  Commonly known as:  CRESTOR  Take 1 tablet (20 mg total) by mouth daily.     Vitamin D3 1000 UNITS Chew  Chew 1 tablet by mouth 3 (three) times daily.       Allergies  Allergen Reactions  . Lorazepam   . Sulfa Antibiotics       The results of significant diagnostics from this hospitalization (including imaging, microbiology, ancillary and laboratory) are listed below for reference.    Significant Diagnostic Studies: Ct Head Wo Contrast  01/03/2014   CLINICAL DATA:  Acute encephalopathy  EXAM: CT HEAD WITHOUT CONTRAST  TECHNIQUE: Contiguous axial images were obtained from the base of the skull through the vertex without intravenous contrast.  COMPARISON:  02/06/2013  FINDINGS: Moderate atrophy. Negative for acute infarct, hemorrhage, or mass lesion. No mass-effect or shift of the midline structures. Calvarium intact.  IMPRESSION: Moderate atrophy.  No acute abnormality.   Electronically Signed   By: Marlan Palauharles  Clark M.D.   On: 01/03/2014  08:22    Microbiology: Recent Results (from the past 240 hour(s))  URINE CULTURE     Status: None   Collection Time    01/02/14  8:57 PM      Result Value Ref Range Status   Specimen Description URINE, CATHETERIZED   Final   Special Requests Normal   Final   Culture  Setup Time     Final   Value: 01/03/2014 01:27     Performed at [facility]   Colony Count     Final   Value: >=100,000 COLONIES/ML     Performed at [facility]   Culture  Final   Value: ESCHERICHIA COLI     Performed at [facility]   Report Status 01/04/2014 FINAL   Final   Organism ID, Bacteria ESCHERICHIA COLI   Final     Labs: Basic Metabolic Panel:  Recent Labs Lab 01/02/14 2115 01/03/14 0403 01/05/14 0455  NA 156* 154* 140  K 3.9 3.7 3.6*  CL 118* 118* 106  CO2 23 22 22   GLUCOSE 108* 103* 105*  BUN 31* 28* 15  CREATININE 1.10 0.97 0.86  CALCIUM 10.2 9.6 8.7   Liver Function Tests:  Recent Labs Lab 01/03/14 0403  AST 70*  ALT 145*  ALKPHOS 64  BILITOT 0.4  PROT 7.0  ALBUMIN 3.4*   No results found for this basename: LIPASE, AMYLASE,  in the last 168 hours  Recent Labs Lab 01/03/14 0403  AMMONIA 41   CBC:  Recent Labs Lab 01/02/14 2115 01/03/14 0403  WBC 7.8 6.2  NEUTROABS 4.2 3.1  HGB 14.2 13.7  HCT 43.7 41.7  MCV 91.6 91.4  PLT 144* 132*   Cardiac Enzymes: No results found for this basename: CKTOTAL, CKMB, CKMBINDEX, TROPONINI,  in the last 168 hours BNP: BNP (last 3 results) No results found for this basename: PROBNP,  in the last 8760 hours CBG:  Recent Labs Lab 01/06/14 0432 01/06/14 0546 01/06/14 1036 01/06/14 1437 01/06/14 1651  GLUCAP 79 101* 95 99 97       Signed:  Cristofer Yaffe L  [facility] 01/07/2014, 1:33 PM

## 2014-01-07 NOTE — Clinical Social Work Note (Addendum)
CSW called [place] admission director regarding pt's possible discharge for today (01/07/2014). CSW left a message with Althia Forts stating that pt was ready for discharge, and that family would like for pt to return to [facility] with Hospice and [facility] following. CSW awaiting a call back. CSW will update medical team once speaking with [facility].  11:55am CSW has met with pt's daughter, Miquel Dunn, and palliative RN regarding discharge disposition. CSW has left another message with Althia Forts regarding discharge to their facility on 01/07/2014. CSW continuing to await a call back.  12:58pm CSW received call from Althia Forts with [facility]. Per Butch Penny, pt is able to return to [facility] as there is no confirmed case of norovirus at facility. Butch Penny requested that Anthony fax pt's discharge summary and FL2 at earliest convenience. CSW called pt's daughter Miquel Dunn) and informed her of information above. Miquel Dunn to call Butch Penny and speak about removing pt's current bed in order for hospital bed to be placed in pt's room. [place] requested ambulance transportation for pt at time of discharge; CSW will assist with transportation needs from [facility] to [facility]. CSW informed palliative RN and RNCM of information above. RNCM to begin process of having DME delivered to [facility] prior to pt's discharge. CSW met with MD and discussed information above. MD to complete discharge summary. CSW continuing to follow and will assist with discharge planning needs.  Discharge paperwork has been faxed to [facility]. Discharge packet to be complete and placed on pt's shadow chart. CSW to call for ambulance once [facility] confirms pt's discharge paperwork.  4:02pm Pt is ready to discharge back to [facility]. Currently [facility] is awaiting pt's hospital bed and other DME to be delivered to [facility]. CSW has attempted to contact [place]  representative regarding an estimated time of delivery, CSW has not heard back. CSW spoke to [facility] [place], Lincoln Brigham [phone]), and requested that when pt's hospital bed arrives at [facility] for [place] to please call [place] [phone]) to let RN know it is "okay" for RN to call for ambulance transportation. CSW spoke with 4N RN and informed her of information above. RN and Lincoln Brigham are both agreeable to plan above. CSW met with pt's daughter, Miquel Dunn, in conference room on 4N and explained information above. [place] appreciative of [facility] waiting for hospital bed to arrive to "avoid a mess."  RN to call for ambulance transportation once given "okay" from [facility] at [phone]  RN please call report to [facility] [place], [place] Worker [phone]

## 2014-01-07 NOTE — Progress Notes (Signed)
Palliative Medicine Team Progress Note  Further conversation with patient's son Erika Warren and daughter Erika Warren deferred to [place] on decision making. Erika Warren is in memory care at [facility] also has 24/7 private caregivers. Ongoing issues with recurrent UTI, high ED utilization-UTI precipitates severe delirium and behavioral disturbance. Patient also having issues with chronic aspiration and multiple falls. Family have noted her dementia to be progressive especially in the last 2 months. Goals are purely QOL and comfort-they want to avoid hospitalization. Patient's daughter Erika Warren has prior experience with [facility] with her daughter who died of Neuroblastoma- she is in active grief counseling at [facility]. She is requesting services for her mother. Given her mothers decline with dementia I think this is reasonable referral for end stage dementia.  Will place CM referral for Hospice evaluation at discharge.  Anderson Malta, DO Palliative Medicine

## 2014-01-07 NOTE — Progress Notes (Addendum)
Notified by Jiles Crocker CMRN, patient and family request services of [facility]) after discharge.  Patient information reviewed with Dr Elliot Gurney, [facility] Medical Director hospice eligible with dx: Alzheimer's Dementia.  Spoke with daughter Lorra Hals [phone]) to initiate education related to hospice services, philosophy and team approach to care [name] voiced good understanding of hospice services and shared she is familiar with [facility] and has been using counseling services since the loss of her own daughter.  [name] informed that she has heard from [facility] staff that there is a question of the NoroVirus at their facility and they are limiting visitors; per CSW Irving Burton she has spoken with Lupita Leash at [facility] and the facility is able to atake pt back today once DME has been delivered Per discussion plan would be for [facility] transport of pt when medically ready and facility can receive pt  DME needs discussed Complete pckg D: fully electric hospital bed with AP&P mattress pad, full rails and overbed table and add a Light weight wheel chair **PLEASE co-ordinate with facility contact Lynnda Child [phone] and daughter [name] [phone] to arrange delivery To [facility] unit [facility] [address] [place] [address]  Initial paperwork faxed to [facility]  Completed d/c summary will need to be faxed to [facility] @ [phone] when final Please notify [facility] when patient is ready to leave unit at d/c call [phone] (or [phone] if after 5 pm);  [facility] information and contact numbers also given to daughter Lorra Hals during visit.   Above information shared with [facility] and Irving Burton CSW Please call with any questions or concerns   Valente David, RN 01/07/2014, 12:07 PM [facility] Liaison [phone]

## 2014-01-07 NOTE — Progress Notes (Signed)
Talked to patient's son about hospice home choices- [name]) referred call to his sister [name]; [facility] for home hospice choices, [name] chose [facility] [place]; Valente David RN with Hospice called for arrangements; Abelino Derrick RN,BSN,MHA [phone]

## 2014-04-18 ENCOUNTER — Emergency Department: Payer: Medicare Other

## 2014-04-18 ENCOUNTER — Encounter (HOSPITAL_BASED_OUTPATIENT_CLINIC_OR_DEPARTMENT_OTHER): Payer: Self-pay | Admitting: Emergency Medicine

## 2014-04-18 ENCOUNTER — Emergency Department (HOSPITAL_BASED_OUTPATIENT_CLINIC_OR_DEPARTMENT_OTHER): Payer: Medicare Other

## 2014-04-18 ENCOUNTER — Inpatient Hospital Stay (HOSPITAL_BASED_OUTPATIENT_CLINIC_OR_DEPARTMENT_OTHER)
Admission: EM | Admit: 2014-04-18 | Discharge: 2014-04-20 | DRG: 871 | Disposition: A | Discharge status: Skilled Nursing Facility | Payer: Medicare Other | Attending: Internal Medicine | Admitting: Internal Medicine

## 2014-04-18 DIAGNOSIS — E785 Hyperlipidemia, unspecified: Secondary | ICD-10-CM

## 2014-04-18 DIAGNOSIS — Z79899 Other long term (current) drug therapy: Secondary | ICD-10-CM

## 2014-04-18 DIAGNOSIS — F0390 Unspecified dementia without behavioral disturbance: Secondary | ICD-10-CM

## 2014-04-18 DIAGNOSIS — G9340 Encephalopathy, unspecified: Secondary | ICD-10-CM

## 2014-04-18 DIAGNOSIS — Z7982 Long term (current) use of aspirin: Secondary | ICD-10-CM

## 2014-04-18 DIAGNOSIS — E870 Hyperosmolality and hypernatremia: Secondary | ICD-10-CM

## 2014-04-18 DIAGNOSIS — Z87891 Personal history of nicotine dependence: Secondary | ICD-10-CM

## 2014-04-18 DIAGNOSIS — A419 Sepsis, unspecified organism: Principal | ICD-10-CM

## 2014-04-18 DIAGNOSIS — G309 Alzheimer's disease, unspecified: Secondary | ICD-10-CM | POA: Diagnosis present

## 2014-04-18 DIAGNOSIS — A498 Other bacterial infections of unspecified site: Secondary | ICD-10-CM | POA: Diagnosis present

## 2014-04-18 DIAGNOSIS — R4182 Altered mental status, unspecified: Secondary | ICD-10-CM

## 2014-04-18 DIAGNOSIS — N390 Urinary tract infection, site not specified: Secondary | ICD-10-CM

## 2014-04-18 DIAGNOSIS — K5900 Constipation, unspecified: Secondary | ICD-10-CM

## 2014-04-18 DIAGNOSIS — E039 Hypothyroidism, unspecified: Secondary | ICD-10-CM

## 2014-04-18 DIAGNOSIS — M810 Age-related osteoporosis without current pathological fracture: Secondary | ICD-10-CM | POA: Diagnosis present

## 2014-04-18 DIAGNOSIS — K5730 Diverticulosis of large intestine without perforation or abscess without bleeding: Secondary | ICD-10-CM

## 2014-04-18 DIAGNOSIS — F0280 Dementia in other diseases classified elsewhere without behavioral disturbance: Secondary | ICD-10-CM | POA: Diagnosis present

## 2014-04-18 DIAGNOSIS — Z66 Do not resuscitate: Secondary | ICD-10-CM | POA: Diagnosis present

## 2014-04-18 HISTORY — DX: Aphasia: R47.01

## 2014-04-18 HISTORY — DX: Low back pain, unspecified: M54.50

## 2014-04-18 HISTORY — DX: Alzheimer's disease, unspecified: G30.9

## 2014-04-18 HISTORY — DX: Low back pain: M54.5

## 2014-04-18 HISTORY — DX: Other chronic pain: G89.29

## 2014-04-18 HISTORY — DX: Dementia in other diseases classified elsewhere, unspecified severity, without behavioral disturbance, psychotic disturbance, mood disturbance, and anxiety: F02.80

## 2014-04-18 HISTORY — DX: Unspecified osteoarthritis, unspecified site: M19.90

## 2014-04-18 LAB — URINALYSIS, ROUTINE W REFLEX MICROSCOPIC
Bilirubin Urine: NEGATIVE
Glucose, UA: NEGATIVE mg/dL
Ketones, ur: NEGATIVE mg/dL
Nitrite: POSITIVE — AB
PROTEIN: 100 mg/dL — AB
SPECIFIC GRAVITY, URINE: 1.019 (ref 1.005–1.030)
UROBILINOGEN UA: 0.2 mg/dL (ref 0.0–1.0)
pH: 6 (ref 5.0–8.0)

## 2014-04-18 LAB — COMPREHENSIVE METABOLIC PANEL
ALBUMIN: 3.5 g/dL (ref 3.5–5.2)
ALT: 19 U/L (ref 0–35)
AST: 21 U/L (ref 0–37)
Alkaline Phosphatase: 57 U/L (ref 39–117)
BILIRUBIN TOTAL: 0.4 mg/dL (ref 0.3–1.2)
BUN: 18 mg/dL (ref 6–23)
CALCIUM: 9.4 mg/dL (ref 8.4–10.5)
CHLORIDE: 106 meq/L (ref 96–112)
CO2: 21 mEq/L (ref 19–32)
CREATININE: 0.9 mg/dL (ref 0.50–1.10)
GFR calc Af Amer: 74 mL/min — ABNORMAL LOW (ref >90)
GFR calc non Af Amer: 64 mL/min — ABNORMAL LOW (ref >90)
Glucose, Bld: 142 mg/dL — ABNORMAL HIGH (ref 70–99)
Potassium: 3.5 mEq/L — ABNORMAL LOW (ref 3.7–5.3)
Sodium: 142 mEq/L (ref 137–147)
Total Protein: 6.9 g/dL (ref 6.0–8.3)

## 2014-04-18 LAB — CBC WITH DIFFERENTIAL/PLATELET
Basophils Absolute: 0 10*3/uL (ref 0.0–0.1)
Basophils Relative: 0 % (ref 0–1)
Eosinophils Absolute: 0 10*3/uL (ref 0.0–0.7)
Eosinophils Relative: 0 % (ref 0–5)
HEMATOCRIT: 36.9 % (ref 36.0–46.0)
Hemoglobin: 12.6 g/dL (ref 12.0–15.0)
LYMPHS ABS: 0.5 10*3/uL — AB (ref 0.7–4.0)
Lymphocytes Relative: 4 % — ABNORMAL LOW (ref 12–46)
MCH: 31.3 pg (ref 26.0–34.0)
MCHC: 34.1 g/dL (ref 30.0–36.0)
MCV: 91.6 fL (ref 78.0–100.0)
MONO ABS: 0.7 10*3/uL (ref 0.1–1.0)
MONOS PCT: 6 % (ref 3–12)
NEUTROS ABS: 11 10*3/uL — AB (ref 1.7–7.7)
Neutrophils Relative %: 90 % — ABNORMAL HIGH (ref 43–77)
PLATELETS: 108 10*3/uL — AB (ref 150–400)
RBC: 4.03 MIL/uL (ref 3.87–5.11)
RDW: 12.3 % (ref 11.5–15.5)
WBC: 12.2 10*3/uL — ABNORMAL HIGH (ref 4.0–10.5)

## 2014-04-18 LAB — I-STAT CG4 LACTIC ACID, ED: Lactic Acid, Venous: 1.03 mmol/L (ref 0.5–2.2)

## 2014-04-18 LAB — URINE MICROSCOPIC-ADD ON

## 2014-04-18 LAB — MRSA PCR SCREENING: MRSA BY PCR: NEGATIVE

## 2014-04-18 MED ORDER — ONDANSETRON HCL 4 MG/2ML IJ SOLN: 4.0000 mg | Freq: Four times a day (QID) | INTRAMUSCULAR | Status: DC | PRN

## 2014-04-18 MED ORDER — DEXTROSE 5 % IV SOLN
1.0000 g | Freq: Once | INTRAVENOUS | Status: AC
  Administered 2014-04-18: 1 g via INTRAVENOUS

## 2014-04-18 MED ORDER — ASPIRIN 81 MG PO CHEW
81.0000 mg | CHEWABLE_TABLET | Freq: Every day | ORAL | Status: DC
  Administered 2014-04-19 – 2014-04-20 (×2): 81 mg via ORAL
  Filled 2014-04-18 (×2): qty 1

## 2014-04-18 MED ORDER — ONDANSETRON HCL 4 MG PO TABS
4.0000 mg | ORAL_TABLET | Freq: Four times a day (QID) | ORAL | Status: DC | PRN
Start: 2014-04-18 — End: 2014-04-20

## 2014-04-18 MED ORDER — CALCIUM CARBONATE ANTACID 500 MG PO CHEW
1000.0000 mg | CHEWABLE_TABLET | ORAL | Status: DC | PRN
  Filled 2014-04-18: qty 2

## 2014-04-18 MED ORDER — LORATADINE 10 MG PO TABS
10.0000 mg | ORAL_TABLET | Freq: Every day | ORAL | Status: DC | PRN
  Filled 2014-04-18: qty 1

## 2014-04-18 MED ORDER — SODIUM CHLORIDE 0.9 % IV SOLN: INTRAVENOUS | Status: DC

## 2014-04-18 MED ORDER — ACETAMINOPHEN 650 MG RE SUPP: 650.0000 mg | Freq: Once | RECTAL | Status: DC

## 2014-04-18 MED ORDER — VITAMIN D3 25 MCG (1000 UT) PO CHEW: 1.0000 | CHEWABLE_TABLET | Freq: Three times a day (TID) | ORAL | Status: DC

## 2014-04-18 MED ORDER — BIOTENE DRY MOUTH MT LIQD: 15.0000 mL | OROMUCOSAL | Status: DC | PRN

## 2014-04-18 MED ORDER — SODIUM CHLORIDE 0.9 % IV SOLN
INTRAVENOUS | Status: DC
  Administered 2014-04-18: 1000 mL via INTRAVENOUS
  Administered 2014-04-18: 15:00:00 via INTRAVENOUS

## 2014-04-18 MED ORDER — ACETAMINOPHEN 650 MG RE SUPP
RECTAL | Status: AC
  Filled 2014-04-18: qty 1

## 2014-04-18 MED ORDER — ACETAMINOPHEN 650 MG RE SUPP
650.0000 mg | Freq: Once | RECTAL | Status: AC
  Administered 2014-04-18: 650 mg via RECTAL

## 2014-04-18 MED ORDER — DEXTROSE 5 % IV SOLN
1.0000 g | INTRAVENOUS | Status: DC
  Administered 2014-04-19: 1 g via INTRAVENOUS
  Filled 2014-04-18 (×2): qty 10

## 2014-04-18 MED ORDER — MELATONIN 5 MG PO TABS: 5.0000 mg | ORAL_TABLET | Freq: Every evening | ORAL | Status: DC | PRN

## 2014-04-18 MED ORDER — ENOXAPARIN SODIUM 40 MG/0.4ML ~~LOC~~ SOLN
40.0000 mg | SUBCUTANEOUS | Status: DC
  Administered 2014-04-18 – 2014-04-19 (×2): 40 mg via SUBCUTANEOUS
  Filled 2014-04-18 (×3): qty 0.4

## 2014-04-18 MED ORDER — ATORVASTATIN CALCIUM 40 MG PO TABS
40.0000 mg | ORAL_TABLET | Freq: Every day | ORAL | Status: DC
  Administered 2014-04-19: 40 mg via ORAL
  Filled 2014-04-18 (×2): qty 1

## 2014-04-18 MED ORDER — SODIUM CHLORIDE 0.9 % IV BOLUS (SEPSIS)
1000.0000 mL | Freq: Once | INTRAVENOUS | Status: AC
  Administered 2014-04-18: 1000 mL via INTRAVENOUS

## 2014-04-18 MED ORDER — ACETAMINOPHEN 325 MG PO TABS
650.0000 mg | ORAL_TABLET | Freq: Four times a day (QID) | ORAL | Status: DC | PRN
  Administered 2014-04-18 – 2014-04-19 (×2): 650 mg via ORAL
  Filled 2014-04-18 (×2): qty 2

## 2014-04-18 MED ORDER — CEFTRIAXONE SODIUM 1 G IJ SOLR
INTRAMUSCULAR | Status: AC
  Filled 2014-04-18: qty 10

## 2014-04-18 MED ORDER — LEVOTHYROXINE SODIUM 100 MCG PO TABS
100.0000 ug | ORAL_TABLET | Freq: Every day | ORAL | Status: DC
  Administered 2014-04-19 – 2014-04-20 (×2): 100 ug via ORAL
  Filled 2014-04-18 (×3): qty 1

## 2014-04-18 MED ORDER — VITAMIN D3 25 MCG (1000 UNIT) PO TABS
1000.0000 [IU] | ORAL_TABLET | Freq: Every day | ORAL | Status: DC
  Administered 2014-04-19 – 2014-04-20 (×2): 1000 [IU] via ORAL
  Filled 2014-04-18 (×2): qty 1

## 2014-04-18 MED ORDER — MEMANTINE HCL 10 MG PO TABS
10.0000 mg | ORAL_TABLET | Freq: Two times a day (BID) | ORAL | Status: DC
  Administered 2014-04-18 – 2014-04-20 (×4): 10 mg via ORAL
  Filled 2014-04-18 (×5): qty 1

## 2014-04-18 MED ORDER — HYDROCODONE-ACETAMINOPHEN 5-325 MG PO TABS: 1.0000 | ORAL_TABLET | Freq: Four times a day (QID) | ORAL | Status: DC | PRN

## 2014-04-18 NOTE — Progress Notes (Signed)
Transfer from [facility]. PCP in [place]. 61 f with dementia, DNR from memory care unit with lethargy, fever to 102, not eating. Found to have UTI. HD stable. Accepted to [facility] medsurg.  CALL FLOW MANAGER ON ARRIVAL TO UNIT.  Crista Curb, MD [facility]

## 2014-04-18 NOTE — ED Provider Notes (Signed)
CSN: [id number]     Arrival date & time 04/18/14  1312 History   First MD Initiated Contact with Patient 04/18/14 1316     Chief Complaint  Patient presents with  . Fever     (Consider location/radiation/quality/duration/timing/severity/associated sxs/prior Treatment) Patient is a 68 y.o. female presenting with fever. The history is provided by a relative. The history is limited by the condition of the patient.  Fever Max temp prior to arrival:  102.5 Temp source:  Oral Severity:  Severe Onset quality:  Gradual Duration:  2 days Timing:  Constant Progression:  Waxing and waning Chronicity:  New Relieved by:  Acetaminophen Worsened by:  Nothing tried Associated symptoms: confusion, congestion, cough, nausea, rhinorrhea and vomiting   Associated symptoms: no diarrhea and no dysuria   Associated symptoms comment:  Urine has appeared dark for the last 4 days and new cough starting today.  Last treated for UTI 4 weeks ago.  2 epsiodes of vomiting today only Risk factors: no immunosuppression   Risk factors comment:  Nursing home pt   Past Medical History  Diagnosis Date  . Hypothyroid   . Allergic rhinitis   . Osteoporosis   . Menopause 2000  . Other and unspecified hyperlipidemia   . Tobacco use disorder   . Rectal polyp 04/2005  . Sigmoid diverticulosis   . Internal hemorrhoids   . Dementia    Past Surgical History  Procedure Laterality Date  . Tonsillectomy and adenoidectomy    . Appendectomy     History reviewed. No pertinent family history. History  Substance Use Topics  . Smoking status: Former Smoker    Quit date: 12/10/2007  . Smokeless tobacco: Never Used  . Alcohol Use: No     Comment: occ   OB History   Grav Para Term Preterm Abortions TAB SAB Ect Mult Living                 Review of Systems  Unable to perform ROS Constitutional: Positive for fever, activity change, appetite change and fatigue.  HENT: Positive for congestion and rhinorrhea.    Respiratory: Positive for cough.   Gastrointestinal: Positive for nausea and vomiting. Negative for diarrhea.  Genitourinary: Negative for dysuria.  Psychiatric/Behavioral: Positive for confusion.      Allergies  Lorazepam and Sulfa antibiotics  Home Medications   Prior to Admission medications   Medication Sig Start Date End Date Taking? Authorizing Provider  acetaminophen (TYLENOL) 325 MG tablet Take 650 mg by mouth 3 (three) times daily with meals.    Historical Provider, MD  acetaminophen (TYLENOL) 325 MG tablet Take 650 mg by mouth every 6 (six) hours as needed for fever.    Historical Provider, MD  aspirin 81 MG chewable tablet Chew 81 mg by mouth daily.    Historical Provider, MD  calcium carbonate (TUMS - DOSED IN MG ELEMENTAL CALCIUM) 500 MG chewable tablet Chew 2 tablets by mouth 3 (three) times daily with meals.    Historical Provider, MD  calcium carbonate (TUMS - DOSED IN MG ELEMENTAL CALCIUM) 500 MG chewable tablet Chew 1,000 mg by mouth every 4 (four) hours as needed for indigestion or heartburn.    Historical Provider, MD  cephALEXin (KEFLEX) 500 MG capsule Take 1 capsule (500 mg total) by mouth every 12 (twelve) hours. For 3 doses 01/07/14   Christiane Ha, MD  Cholecalciferol (VITAMIN D3) 1000 UNITS CHEW Chew 1 tablet by mouth 3 (three) times daily.    Historical Provider, MD  Cranberry Fruit 475 MG CAPS Take 475 mg by mouth 2 (two) times daily. May open and mix with food    Historical Provider, MD  guaiFENesin (ROBITUSSIN) 100 MG/5ML SOLN Take 10 mLs by mouth every 4 (four) hours as needed for cough or to loosen phlegm.    Historical Provider, MD  HYDROcodone-acetaminophen (NORCO/VICODIN) 5-325 MG per tablet Take 1 tablet by mouth every 6 (six) hours as needed for moderate pain.    Historical Provider, MD  ibuprofen (ADVIL,MOTRIN) 100 MG/5ML suspension Take 400 mg by mouth every 6 (six) hours as needed for mild pain.    Historical Provider, MD  levothyroxine  (SYNTHROID, LEVOTHROID) 100 MCG tablet Take 1 tablet (100 mcg total) by mouth daily before breakfast. 01/07/14   Christiane Haorinna L Sullivan, MD  loratadine (CLARITIN) 10 MG tablet Take 10 mg by mouth daily as needed for allergies.    Historical Provider, MD  Melatonin 5 MG TABS Take 5 mg by mouth at bedtime as needed (sleep).    Historical Provider, MD  memantine (NAMENDA) 10 MG tablet Take 10 mg by mouth 2 (two) times daily.    Historical Provider, MD  Multiple Vitamins-Minerals (MULTIVITAMIN GUMMIES ADULT PO) Take 1 each by mouth daily.    Historical Provider, MD  POLYETHYLENE GLYCOL 3350 PO Take 0.5 each by mouth daily. Patient takes a 1/2 capful in juice of choice    Historical Provider, MD  rosuvastatin (CRESTOR) 20 MG tablet Take 1 tablet (20 mg total) by mouth daily. 04/28/12 04/28/13  Maurice MarchBarbara B McPherson, MD   BP 118/55  Pulse 93  Temp(Src) 101 F (38.3 C) (Rectal)  Resp 20  SpO2 93% Physical Exam  Nursing note and vitals reviewed. Constitutional: She appears well-developed and well-nourished. No distress.  somnolent  HENT:  Head: Normocephalic and atraumatic.  Right Ear: Tympanic membrane and ear canal normal.  Left Ear: Tympanic membrane and ear canal normal.  Mouth/Throat: Mucous membranes are dry.  Eyes: EOM are normal. Pupils are equal, round, and reactive to light.  Cardiovascular: Normal rate, regular rhythm, normal heart sounds and intact distal pulses.  Exam reveals no friction rub.   No murmur heard. Pulmonary/Chest: Effort normal and breath sounds normal. She has no wheezes. She has no rales.  Abdominal: Soft. Bowel sounds are normal. She exhibits no distension. There is no tenderness. There is no rebound and no guarding.  Musculoskeletal: Normal range of motion. She exhibits no tenderness.  No edema  Neurological:  Somnolent but will briefly open eyes to voice  Skin: Skin is warm and dry. No rash noted.  Flushed and warm to the touch  Psychiatric: She has a normal mood and  affect. Her behavior is normal.    ED Course  Procedures (including critical care time) Labs Review Labs Reviewed  CBC WITH DIFFERENTIAL - Abnormal; Notable for the following:    WBC 12.2 (*)    Platelets 108 (*)    Neutrophils Relative % 90 (*)    Lymphocytes Relative 4 (*)    Neutro Abs 11.0 (*)    Lymphs Abs 0.5 (*)    All other components within normal limits  COMPREHENSIVE METABOLIC PANEL - Abnormal; Notable for the following:    Potassium 3.5 (*)    Glucose, Bld 142 (*)    GFR calc non Af Amer 64 (*)    GFR calc Af Amer 74 (*)    All other components within normal limits  URINALYSIS, ROUTINE W REFLEX MICROSCOPIC - Abnormal; Notable for the  following:    APPearance CLOUDY (*)    Hgb urine dipstick LARGE (*)    Protein, ur 100 (*)    Nitrite POSITIVE (*)    Leukocytes, UA SMALL (*)    All other components within normal limits  URINE MICROSCOPIC-ADD ON - Abnormal; Notable for the following:    Bacteria, UA MANY (*)    All other components within normal limits  URINE CULTURE  I-STAT CG4 LACTIC ACID, ED    Imaging Review Dg Chest 2 View  04/18/2014   CLINICAL DATA:  Unresponsive patient.  EXAM: CHEST  2 VIEW  COMPARISON:  12/12/2012  FINDINGS: Cardiac silhouette is mildly enlarged. Normal mediastinal and hilar contours.  Lungs are clear.  No pleural effusion or pneumothorax.  Bony thorax is demineralized but intact.  IMPRESSION: No acute cardiopulmonary disease.   Electronically Signed   By: Amie Portland M.D.   On: 04/18/2014 14:22     EKG Interpretation None      MDM   Final diagnoses:  UTI (lower urinary tract infection)  Altered mental state    Pt coming from nursing home with more lethargy, fever and decreased po's.  Hx of UTI's in the past and new cough today.  Last treated for UTI 4 weeks ago. Pt is febrile here and sleeping.  Only wakes up minimally.  No reproducible abd pain.  No signs of cellulitis.  CBC, CMP, UA, urine culture, lactic acid, chest  x-ray pending for further evaluation. Patient given PR Tylenol for temperature of 101.  2:29 PM CXR wnl.  Leukocytosis of 12,000 and UA suspicious for early infection with positive nitrites and leukocytes.  3:05 PM Will admit for further care.  Gwyneth Sprout, MD 04/18/14 1525

## 2014-04-18 NOTE — Progress Notes (Addendum)
Patient trasfered from [facility] to [phone] via [facility]; alert and oriented to person 4; family with patient all the time (sitter in her room); no signs of pain; IV saline locked in RH; skin intact. Orient patient and family to room and unit; ; gave patient care guide; instructed how to use the call bell and  fall risk precautions. Will continue to monitor the patient. MD was informed of patient being on the floor.

## 2014-04-18 NOTE — H&P (Signed)
Erika Warren History and Physical  Erika Ruffatricia Milo [facility] ZOX:[id number] DOB: December 20, 1945 DOA: 04/18/2014  Referring physician: EDP PCP: Pcp Not In System   Chief Complaint: Hypersomnolence and vomiting  HPI: Erika Warren is a 68 y.o. female with history of advanced dementia, hypothyroidism and recurrent UTIs who resides in the memory care unit in presents with above complaints. The family/son at bedside assisting with history. He states that at baseline patient is nonverbal and ambulates short distances with someone using gait belt to guide her and by mouth intake is usually very good. He states that last p.m. she developed fevers up to 101.2, and was more sleepy than usual. Also today she vomited and that prompted a visit to the ED. She was [facility] and urinalysis was consistent with a UTI, chest x-ray showed no acute infiltrates. She is admitted for further evaluation and management.    Review of Systems The patient denies anorexia, weight loss,, vision loss, decreased hearing, hoarseness, chest pain, syncope, dyspnea on exertion, peripheral edema, balance deficits, hemoptysis, abdominal pain, melena, hematochezia, severe indigestion/heartburn, hematuria, suspicious skin lesions, transient blindness,  Past Medical History  Diagnosis Date  . Hypothyroid   . Allergic rhinitis   . Osteoporosis   . Menopause 2000  . Other and unspecified hyperlipidemia   . Tobacco use disorder   . Rectal polyp 04/2005  . Sigmoid diverticulosis   . Internal hemorrhoids   . Alzheimer's dementia     "late stage" (04/18/2014)  . Nonverbal   . Arthritis     "hands, legs, feet" (04/18/2014)  . Chronic lower back pain    Past Surgical History  Procedure Laterality Date  . Appendectomy    . Tonsillectomy and adenoidectomy    . Cesarean section  1979  . Mouth surgery  1990's    "don't know why"   Social History:  reports that she quit smoking about 6 years ago. Her  smoking use included Cigarettes. She has a 15 pack-year smoking history. She has never used smokeless tobacco. She reports that she does not drink alcohol or use illicit drugs.  Allergies  Allergen Reactions  . Lorazepam   . Sulfa Antibiotics     History reviewed. No pertinent family history.   Prior to Admission medications   Medication Sig Start Date End Date Taking? Authorizing Provider  acetaminophen (TYLENOL) 325 MG tablet Take 650 mg by mouth every 6 (six) hours as needed for fever.   Yes Historical Provider, MD  aspirin 81 MG chewable tablet Chew 81 mg by mouth daily.   Yes Historical Provider, MD  calcium carbonate (TUMS - DOSED IN MG ELEMENTAL CALCIUM) 500 MG chewable tablet Chew 1,000 mg by mouth every 4 (four) hours as needed for indigestion or heartburn.   Yes Historical Provider, MD  Cholecalciferol (VITAMIN D3) 1000 UNITS CHEW Chew 1 tablet by mouth 3 (three) times daily.   Yes Historical Provider, MD  Cranberry Fruit 475 MG CAPS Take 475 mg by mouth 2 (two) times daily. May open and mix with food   Yes Historical Provider, MD  HYDROcodone-acetaminophen (NORCO/VICODIN) 5-325 MG per tablet Take 1 tablet by mouth every 6 (six) hours as needed for moderate pain.   Yes Historical Provider, MD  ibuprofen (ADVIL,MOTRIN) 100 MG/5ML suspension Take 400 mg by mouth every 6 (six) hours as needed for mild pain.   Yes Historical Provider, MD  levothyroxine (SYNTHROID, LEVOTHROID) 100 MCG tablet Take 1 tablet (100 mcg total) by mouth daily  before breakfast. 01/07/14  Yes Christiane Ha, MD  loratadine (CLARITIN) 10 MG tablet Take 10 mg by mouth daily as needed for allergies.   Yes Historical Provider, MD  Melatonin 5 MG TABS Take 5 mg by mouth at bedtime as needed (sleep).   Yes Historical Provider, MD  memantine (NAMENDA) 10 MG tablet Take 10 mg by mouth 2 (two) times daily.   Yes Historical Provider, MD  Multiple Vitamins-Minerals (MULTIVITAMIN GUMMIES ADULT PO) Take 1 each by mouth  daily.   Yes Historical Provider, MD  POLYETHYLENE GLYCOL 3350 PO Take 0.5 each by mouth daily. Patient takes a 1/2 capful in juice of choice   Yes Historical Provider, MD  rosuvastatin (CRESTOR) 20 MG tablet Take 1 tablet (20 mg total) by mouth daily. 04/28/12 04/28/13  Maurice March, MD   Physical Exam: Filed Vitals:   04/18/14 1748  BP: 122/68  Pulse: 84  Temp: 98.7 F (37.1 C)  Resp: 18    BP 122/68  Pulse 84  Temp(Src) 98.7 F (37.1 C) (Oral)  Resp 18  Ht 5\' 6"  (1.676 m)  Wt 68.811 kg (151 lb 11.2 oz)  BMI 24.50 kg/m2  SpO2 94% Constitutional: Vital signs reviewed.  Patient is a well-developed and well-nourished in no acute distress, nonverbal, keeps eyes closed and does not follow commands Head: Normocephalic and atraumatic Mouth: no erythema or exudates, slightly dry MM Eyes: PERRL, EOMI, conjunctivae normal, No scleral icterus.  Neck: Supple, Trachea midline normal ROM, No JVD, mass, thyromegaly, or carotid bruit present.  Cardiovascular: RRR, S1 normal, S2 normal, no MRG, pulses symmetric and intact bilaterally Pulmonary/Chest: normal respiratory effort, CTAB, no wheezes, rales, or rhonchi Abdominal: Soft. Non-tender, non-distended, bowel sounds are normal, no masses, organomegaly, or guarding present.  GU: no CVA tenderness Musculoskeletal: No joint deformities, erythema, or stiffness, ROM full and no nontender Hematology: no cervical, inginal, or axillary adenopathy.  Neurological: cranial nerve II-XII are grossly intact, was all extremities grossly no focal motor deficit Skin: Warm, dry and intact. No rash, cyanosis, or clubbing.                Labs on Admission:  Basic Metabolic Panel:  Recent Labs Lab 04/18/14 1325  NA 142  K 3.5*  CL 106  CO2 21  GLUCOSE 142*  BUN 18  CREATININE 0.90  CALCIUM 9.4   Liver Function Tests:  Recent Labs Lab 04/18/14 1325  AST 21  ALT 19  ALKPHOS 57  BILITOT 0.4  PROT 6.9  ALBUMIN 3.5   No results  found for this basename: LIPASE, AMYLASE,  in the last 168 hours No results found for this basename: AMMONIA,  in the last 168 hours CBC:  Recent Labs Lab 04/18/14 1325  WBC 12.2*  NEUTROABS 11.0*  HGB 12.6  HCT 36.9  MCV 91.6  PLT 108*   Cardiac Enzymes: No results found for this basename: CKTOTAL, CKMB, CKMBINDEX, TROPONINI,  in the last 168 hours  BNP (last 3 results) No results found for this basename: PROBNP,  in the last 8760 hours CBG: No results found for this basename: GLUCAP,  in the last 168 hours  Radiological Exams on Admission: Dg Chest 2 View  04/18/2014   CLINICAL DATA:  Unresponsive patient.  EXAM: CHEST  2 VIEW  COMPARISON:  12/12/2012  FINDINGS: Cardiac silhouette is mildly enlarged. Normal mediastinal and hilar contours.  Lungs are clear.  No pleural effusion or pneumothorax.  Bony thorax is demineralized but intact.  IMPRESSION: No  acute cardiopulmonary disease.   Electronically Signed   By: Amie Portland M.D.   On: 04/18/2014 14:22     Assessment/Plan Active Problems:    UTI (lower urinary tract infection) with sepsis syndrome -As discussed above, continue empiric antibiotics with Rocephin -Obtain urine cultures, follow   Acute encephalopathy -Secondary to above and patient was advanced dementia -Treat with antibiotics as above   Dementia -Continue outpatient medications Hypothyroidism -Continue outpatient medications   Code Status: DO NOT RESUSCITATE Family Communication: Son and caregiver at bedside Disposition Plan: Admit to MedSurg  Time spent: >52mins  Kela Millin Erika Warren Pager [phone]

## 2014-04-18 NOTE — ED Notes (Signed)
Brought in by EMS for decreased po intake , fever , ? Dehydration , UTI, also c/o congestion and cough

## 2014-04-19 ENCOUNTER — Inpatient Hospital Stay (HOSPITAL_COMMUNITY): Payer: Medicare Other

## 2014-04-19 DIAGNOSIS — K5900 Constipation, unspecified: Secondary | ICD-10-CM | POA: Diagnosis present

## 2014-04-19 LAB — BASIC METABOLIC PANEL
BUN: 12 mg/dL (ref 6–23)
CALCIUM: 8.7 mg/dL (ref 8.4–10.5)
CO2: 20 mEq/L (ref 19–32)
Chloride: 109 mEq/L (ref 96–112)
Creatinine, Ser: 0.82 mg/dL (ref 0.50–1.10)
GFR calc Af Amer: 83 mL/min — ABNORMAL LOW (ref >90)
GFR, EST NON AFRICAN AMERICAN: 72 mL/min — AB (ref >90)
Glucose, Bld: 94 mg/dL (ref 70–99)
Potassium: 3.8 mEq/L (ref 3.7–5.3)
Sodium: 145 mEq/L (ref 137–147)

## 2014-04-19 LAB — CBC
HEMATOCRIT: 36 % (ref 36.0–46.0)
HEMOGLOBIN: 11.9 g/dL — AB (ref 12.0–15.0)
MCH: 30.6 pg (ref 26.0–34.0)
MCHC: 33.1 g/dL (ref 30.0–36.0)
MCV: 92.5 fL (ref 78.0–100.0)
Platelets: 85 10*3/uL — ABNORMAL LOW (ref 150–400)
RBC: 3.89 MIL/uL (ref 3.87–5.11)
RDW: 12.8 % (ref 11.5–15.5)
WBC: 6.4 10*3/uL (ref 4.0–10.5)

## 2014-04-19 MED ORDER — DOCUSATE SODIUM 100 MG PO CAPS
100.0000 mg | ORAL_CAPSULE | Freq: Two times a day (BID) | ORAL | Status: DC
  Administered 2014-04-19 (×2): 100 mg via ORAL
  Filled 2014-04-19 (×4): qty 1

## 2014-04-19 MED ORDER — IBUPROFEN 400 MG PO TABS
400.0000 mg | ORAL_TABLET | Freq: Four times a day (QID) | ORAL | Status: DC | PRN
  Administered 2014-04-19: 400 mg via ORAL
  Filled 2014-04-19: qty 1

## 2014-04-19 MED ORDER — FLEET ENEMA 7-19 GM/118ML RE ENEM
1.0000 | ENEMA | Freq: Once | RECTAL | Status: AC
  Administered 2014-04-19: 1 via RECTAL
  Filled 2014-04-19: qty 1

## 2014-04-19 NOTE — Progress Notes (Signed)
Utilization review completed.  

## 2014-04-19 NOTE — Evaluation (Signed)
Physical Therapy Evaluation Patient Details Name: Erika Warren MRN: [id number] DOB: 28-Oct-1946 Today's Date: 04/19/2014   History of Present Illness  Patient with h/o dementia [facility] with fever and UTI.  Clinical Impression  Patient presents without skilled PT needs at this time as close to functional baseline.  Feel caregiver manages patient well and did discuss with family what would indicate need for skilled PT.      Follow Up Recommendations No PT follow up    Equipment Recommendations  None recommended by PT    Recommendations for Other Services       Precautions / Restrictions Precautions Precautions: Fall Required Braces or Orthoses: Other Brace/Splint Other Brace/Splint: wearing left knee orthopedic brace with M/L stabilizers      Mobility  Bed Mobility Overal bed mobility: Needs Assistance Bed Mobility: Supine to Sit     Supine to sit: Max assist     General bed mobility comments: to guide up to sitting up right  Transfers Overall transfer level: Needs assistance   Transfers: Sit to/from Stand Sit to Stand: Max assist;+2 safety/equipment         General transfer comment: son there and assisting due to patient not fully cooperative  Ambulation/Gait Ambulation/Gait assistance: Mod assist Ambulation Distance (Feet): 12 Feet Assistive device: 1 person hand held assist;2 person hand held assist Gait Pattern/deviations: Trunk flexed;Shuffle;Decreased stride length;Wide base of support;Leaning posteriorly     General Gait Details: needs assist for safety +1-2 more due to level of cooperation than balance  Stairs            Wheelchair Mobility    Modified Rankin (Stroke Patients Only)       Balance Overall balance assessment: History of Falls;Needs assistance   Sitting balance-Leahy Scale: Fair Sitting balance - Comments: obvious weakness in postural muscles with flexed posture, head and neck  flexion versus forward head posturing with thoracic kyphosis     Standing balance-Leahy Scale: Poor Standing balance comment: difficult to assess actual balance due to flexed posture and pushing back due to decreased cooperation                             Pertinent Vitals/Pain No specific indication of pain    Home Living Family/patient expects to be discharged to:: Skilled nursing facility                 Additional Comments[facility]    Prior Function Level of Independence: Needs assistance   Gait / Transfers Assistance Needed: assist of one patient holds both of her hands to caregiver; has hired aides at facility            [employer] Dominance        Extremity/Trunk Assessment               Lower Extremity Assessment: Difficult to assess due to impaired cognition ([facility] with patient moving around)      Cervical / Trunk Assessment: Kyphotic  Communication   Communication: Expressive difficulties;Other (comment) (due to advanced dementia)  Cognition Arousal/Alertness: Awake/alert Behavior During Therapy: Restless Overall Cognitive Status: History of cognitive impairments - at baseline                      General Comments      Exercises        Assessment/Plan  PT Assessment Patent does not need any further PT services  PT Diagnosis     PT Problem List    PT Treatment Interventions     PT Goals (Current goals can be found in the Care Plan section) Acute Rehab PT Goals PT Goal Formulation: No goals set, d/c therapy    Frequency     Barriers to discharge        Co-evaluation               End of Session Equipment Utilized During Treatment: Gait belt Activity Tolerance: Patient tolerated treatment well Patient left: with family/visitor present           Time: 2103-1281 PT Time Calculation (min): 13 min   Charges:   PT Evaluation $Initial PT Evaluation Tier  I: 1 Procedure     PT G Codes:          Demetrie Borge,CYNDI 05-17-2014, 12:25 PM Sheran Lawless, PT [phone] 2014-05-17

## 2014-04-19 NOTE — Progress Notes (Signed)
  I have directly reviewed the clinical findings, lab, imaging studies and management of this patient in detail. I have interviewed and examined the patient and agree with the documentation,  as recorded by the Physician extender.  Leroy Sea M.D on 04/19/2014 at 11:16 AM  [facility] Office  [phone]

## 2014-04-19 NOTE — Clinical Social Work Note (Signed)
CSW made several attempts to reach admissions person with Erika Warren to determine what facility will need for a weekend DC, but was not successful with getting this information. CSW will leave report for weekend CSW.

## 2014-04-19 NOTE — Progress Notes (Signed)
PROGRESS NOTE  Erika Warren [facility] WGN:[id number] DOB: [birth year] DOA: 04/18/2014 PCP: Pcp Not In System  Erika Warren is a 68 y.o. female with history of advanced dementia, hypothyroidism and recurrent UTIs who resides in the memory care unit in presents with above complaints. The family/son at bedside assisting with history. He states that at baseline patient is nonverbal and ambulates short distances with someone using gait belt to guide her and by mouth intake is usually very good. He states that last p.m. she developed fevers up to 101.2, and was more sleepy than usual. Also today she vomited and that prompted a visit to the ED. She was initially seen at the [facility] ED and urinalysis was consistent with a UTI, chest x-ray showed no acute infiltrates. She is admitted for further evaluation and management.   Assessment/Plan:  UTI On rocephin Culture pending  Acute encephalopathy in the setting of Dementia Due to urinary tract infection.  Seems to have resolved with IVF and antibiotics. Patient non verbal at baseline. Per son is "about back to baseline, but sleeping a little more than usual"  Obstipation As seen on abdominal xray Patient had good results with enema.  Will give daily stool softeners.  Hypothyroidism Continue synthroid.  Vomiting  Likely secondary to UTI and possibly severe constipation. Advancing diet as tolerated.   DVT Prophylaxis:  lovenox  Code Status: DNR Family Communication: son [name] at bedside. Disposition Plan: to [facility] when appropriate.  Likely 6/13.   Consultants:  none  Procedures:  none  Antibiotics: Anti-infectives   Start     Dose/Rate Route Frequency Ordered Stop   04/19/14 1500  cefTRIAXone (ROCEPHIN) 1 g in dextrose 5 % 50 mL IVPB     1 g 100 mL/hr over 30 Minutes Intravenous Every 24 hours 04/18/14 2012     04/18/14 1520  cefTRIAXone (ROCEPHIN) 1 G injection    Comments:  Lucendia Herrlich   : cabinet  override      04/18/14 1520 04/19/14 0329   04/18/14 1515  cefTRIAXone (ROCEPHIN) 1 g in dextrose 5 % 50 mL IVPB     1 g 100 mL/hr over 30 Minutes Intravenous  Once 04/18/14 1511 04/18/14 1554        HPI/Subjective: Patient non verbal.  Objective: Filed Vitals:   04/18/14 2051 04/18/14 2301 04/19/14 0204 04/19/14 0457  BP: 151/86   158/75  Pulse: 110   90  Temp: 101.3 F (38.5 C) 99.4 F (37.4 C) 99.2 F (37.3 C) 98.7 F (37.1 C)  TempSrc: Oral  Axillary Oral  Resp: 18   18  Height:      Weight:      SpO2: 97%   97%   No intake or output data in the 24 hours ending 04/19/14 1036 Filed Weights   04/18/14 1748  Weight: 68.811 kg (151 lb 11.2 oz)    Exam: General: Well developed, well nourished, NAD, appears stated age.  Private duty CNA at bedside.  HEENT:  Patient does not open eyes for me, MMM.  Neck: Supple, no JVD, no masses  Cardiovascular: RRR, S1 S2 auscultated, no rubs, murmurs or gallops.   Respiratory: Clear to auscultation bilaterally with equal chest rise  Abdomen: Soft, nontender, mildly distended, + bowel sounds  Extremities: warm dry without cyanosis clubbing or edema.  Skin: Without rashes exudates or nodules.   Psych: severely demented.  Non verbal.  Sleepy this morning.   Data Reviewed: Basic Metabolic Panel:  Recent Labs  Lab 04/18/14 1325 04/19/14 0500  NA 142 145  K 3.5* 3.8  CL 106 109  CO2 21 20  GLUCOSE 142* 94  BUN 18 12  CREATININE 0.90 0.82  CALCIUM 9.4 8.7   Liver Function Tests:  Recent Labs Lab 04/18/14 1325  AST 21  ALT 19  ALKPHOS 57  BILITOT 0.4  PROT 6.9  ALBUMIN 3.5   CBC:  Recent Labs Lab 04/18/14 1325 04/19/14 0500  WBC 12.2* 6.4  NEUTROABS 11.0*  --   HGB 12.6 11.9*  HCT 36.9 36.0  MCV 91.6 92.5  PLT 108* 85*     Recent Results (from the past 240 hour(s))  MRSA PCR SCREENING     Status: None   Collection Time    04/18/14  6:04 PM      Result Value Ref Range Status   MRSA by PCR NEGATIVE   NEGATIVE Final   Comment:            The GeneXpert MRSA Assay (FDA     approved for NASAL specimens     only), is one component of a     comprehensive MRSA colonization     surveillance program. It is not     intended to diagnose MRSA     infection nor to guide or     monitor treatment for     MRSA infections.     Studies: Dg Chest 2 View  04/18/2014   CLINICAL DATA:  Unresponsive patient.  EXAM: CHEST  2 VIEW  COMPARISON:  12/12/2012  FINDINGS: Cardiac silhouette is mildly enlarged. Normal mediastinal and hilar contours.  Lungs are clear.  No pleural effusion or pneumothorax.  Bony thorax is demineralized but intact.  IMPRESSION: No acute cardiopulmonary disease.   Electronically Signed   By: Amie Portlandavid  Ormond M.D.   On: 04/18/2014 14:22   Dg Abd 1 View  04/19/2014   CLINICAL DATA:  Abdominal pain  EXAM: ABDOMEN - 1 VIEW  COMPARISON:  None.  FINDINGS: Scattered large and small bowel gas is noted. No obstructive changes are seen. Fecal material is noted within the rectum without definitive impaction. Degenerative change of the lumbar spine is seen.  IMPRESSION: No acute abnormality noted.   Electronically Signed   By: Alcide CleverMark  Lukens M.D.   On: 04/19/2014 08:35    Scheduled Meds: . aspirin  81 mg Oral Daily  . atorvastatin  40 mg Oral q1800  . cefTRIAXone (ROCEPHIN)  IV  1 g Intravenous Q24H  . cholecalciferol  1,000 Units Oral Daily  . docusate sodium  100 mg Oral BID  . enoxaparin (LOVENOX) injection  40 mg Subcutaneous Q24H  . levothyroxine  100 mcg Oral QAC breakfast  . memantine  10 mg Oral BID   Continuous Infusions:   Principal Problem:   UTI (lower urinary tract infection) Active Problems:   Obstipation   Dementia   Acute encephalopathy   Sepsis    Conley CanalYork, Reiss Mowrey L, PA-C  [facility] Pager [phone]. If 7PM-7AM, please contact night-coverage at [email or web address], password [email or web address] 04/19/2014, 10:36 AM  LOS: 1 day

## 2014-04-19 NOTE — Progress Notes (Signed)
Occupational Therapy Discharge Patient Details Name: Erika Warren MRN: [id number] DOB: [birth year] Today's Date: 04/19/2014 Time:  -     Patient discharged from OT services secondary to defer back to Memory care Unit at this time.   Please see latest therapy progress note for current level of functioning and progress toward goals.    Progress and discharge plan discussed with patient and/or caregiver: Patient unable to participate in discharge planning and no caregivers available       Harolyn Rutherford Pager: [phone]  04/19/2014, 7:55 AM

## 2014-04-19 NOTE — Clinical Social Work Psychosocial (Signed)
Clinical Social Work Department BRIEF PSYCHOSOCIAL ASSESSMENT 04/19/2014  Patient:  Erika Warren, Erika Warren     Account Number:  [id number]     Admit date:  04/18/2014  Clinical Social Worker:  Lovey Newcomer  Date/Time:  04/19/2014 01:00 PM  Referred by:  Physician  Date Referred:  04/19/2014 Referred for  ALF Placement   Other Referral:   Interview type:  Other - See comment Other interview type:   Patient's caregiver interviewed at bedside.    PSYCHOSOCIAL DATA Living Status:  FACILITY Admitted from facility:  [facility] Level of care:  Assisted Living Primary support name:  Erika Warren Primary support relationship to patient:  CHILD, ADULT Degree of support available:   Support is good. Patient's HPOA is Erika Warren.    CURRENT CONCERNS Current Concerns  Post-Acute Placement   Other Concerns:    SOCIAL WORK ASSESSMENT / PLAN CSW met with patient and caregiver at bedside to complete assessment. Patient not able to contribute to assessment at this time. Patient's caregiver at bedside states that patient is from [facility] and that the plan is for patient to return at discharge. Caregiver states that patient lives at facility but the family also pays for private caregivers to stay with patient. Caregiver reports that patient's HPOA is Erika Warren. CSW will also contact Erika Warren.   Assessment/plan status:  Psychosocial Support/Ongoing Assessment of Needs Other assessment/ plan:   Complete Fl2, Fax   Information/referral to community resources:    PATIENT'S/FAMILY'S RESPONSE TO PLAN OF CARE: Patient's caregiver states that the plan is for patient to return to [facility] at discharge. Patient is for a potential discharge for the weekend. CSW will leave report for weekend CSW.       Liz Beach MSW, [place], [place], [phone]

## 2014-04-19 NOTE — Clinical Social Work Note (Signed)
CSW has put a call in to [facility] to determine what facility needs for patient to return tomorrow. CSW still waiting for callback.   Roddie Mc MSW, [place], [place], [phone]

## 2014-04-19 NOTE — Discharge Summary (Addendum)
Physician Discharge Summary  Erika Warren [facility] ZOX:[id number] DOB: 1945/11/29 DOA: 04/18/2014  PCP: Pcp Not In System  Admit date: 04/18/2014 Discharge date: 04/20/2014  Time spent: 45 minutes  Recommendations for Outpatient Follow-up:  1. Patient needs increased regular bowel regimen.  Has daily BMs but was full of stool. 2. Consider checking TSH at hospital follow up.  (was 0.076 in March 2015)  Discharge Diagnoses:  Principal Problem:   UTI (lower urinary tract infection) Active Problems:   Dementia   Acute encephalopathy   Sepsis   Obstipation   Discharge Condition: stable.  Back to baseline.  Diet recommendation: regular as tolerated.  Filed Weights   04/18/14 1748  Weight: 68.811 kg (151 lb 11.2 oz)    History of present illness:  Erika Warren is a 67 y.o. female with history of advanced dementia, hypothyroidism and recurrent UTIs who resides in the memory care.  She is nonverbal and can ambulate short distances with assistance.  The night prior to admission she developed a fever of 101.2 and was more sleepy than usual. With IV Rocephin she is back to baseline and will be placed on Ceftin upon discharge.   Hospital Course:   UTI  Recurrent problem Culture shows Escherichia coli, sensitivities pending, responded well to empiric Rocephin and we'll place her on 3 more days of oral Ceftin.  Acute encephalopathy in the setting of Dementia  Due to urinary tract infection. Seems to have resolved with IVF and antibiotics.  Patient non verbal at baseline.  Per son is "about back to baseline, but sleeping a little more than usual"    Note family requests to use narcotics on an as needed basis only if extremely necessary     Obstipation  As seen on abdominal xray  Patient had good results with enema.  Will give daily stool softeners.   Hypothyroidism  Continue synthroid.  Recommend checking TSH and adjusting synthroid dosing as needed after  discharge.  Vomiting and abdominal pain. Likely secondary to UTI and possibly severe constipation.  Slowly advanced diet back to soft solids.  Patient has good PO intake. Abdominal xray showed significant stool build up.   Patient has a hx of diverticulitis.  Last colonoscopy by Dr. Loreta Ave in 2006.   Discharge Exam: Filed Vitals:   04/20/14 0508  BP: 135/69  Pulse: 80  Temp: 98 F (36.7 C)  Resp: 18    General: wd, wn female, who appears stated age.  Nonverbal. Private duty nurse at bedside. Cardiovascular: rrr non m/g/r Respiratory: cta, no w/c/r Abdomen:  Soft, Nt, nd, +bs, No obvious masses Extremities:  No swelling or cyanosis, warm to touch.  Discharge Instructions     Discharge Instructions   Discharge instructions    Complete by:  As directed   Follow with Primary MD Pcp Not In System in 7 days       Activity: As tolerated with Full fall precautions use walker/cane & assistance as needed   Disposition SNF   Diet: Heart Healthy with full feeding assistance and aspiration precautions .  For Heart failure patients - Check your Weight same time everyday, if you gain over 2 pounds, or you develop in leg swelling, experience more shortness of breath or chest pain, call your Primary MD immediately. Follow Cardiac Low Salt Diet and 1.8 lit/day fluid restriction.   On your next visit with her primary care physician please Get Medicines reviewed and adjusted.  Please request your Prim.MD to go over all Hospital Tests  and Procedure/Radiological results at the follow up, please get all Hospital records sent to your Prim MD by signing hospital release before you go home.   If you experience worsening of your admission symptoms, develop shortness of breath, life threatening emergency, suicidal or homicidal thoughts you must seek medical attention immediately by calling 911 or calling your MD immediately  if symptoms less severe.  You Must read complete  instructions/literature along with all the possible adverse reactions/side effects for all the Medicines you take and that have been prescribed to you. Take any new Medicines after you have completely understood and accpet all the possible adverse reactions/side effects.   Do not drive and provide baby sitting services if your were admitted for syncope or siezures until you have seen by Primary MD or a Neurologist and advised to do so again.  Do not drive when taking Pain medications.    Do not take more than prescribed Pain, Sleep and Anxiety Medications  Special Instructions: If you have smoked or chewed Tobacco  in the last 2 yrs please stop smoking, stop any regular Alcohol  and or any Recreational drug use.  Wear Seat belts while driving.   Please note  You were cared for by a hospitalist during your hospital stay. If you have any questions about your discharge medications or the care you received while you were in the hospital after you are discharged, you can call the unit and asked to speak with the hospitalist on call if the hospitalist that took care of you is not available. Once you are discharged, your primary care physician will handle any further medical issues. Please note that NO REFILLS for any discharge medications will be authorized once you are discharged, as it is imperative that you return to your primary care physician (or establish a relationship with a primary care physician if you do not have one) for your aftercare needs so that they can reassess your need for medications and monitor your lab values.  Follow with Primary MD Pcp Not In System in 7 days       Activity: As tolerated with Full fall precautions use walker/cane & assistance as needed   Disposition SNF   Diet: Heart Healthy with full feeding assistance and aspiration precautions .  For Heart failure patients - Check your Weight same time everyday, if you gain over 2 pounds, or you develop in leg  swelling, experience more shortness of breath or chest pain, call your Primary MD immediately. Follow Cardiac Low Salt Diet and 1.8 lit/day fluid restriction.   On your next visit with her primary care physician please Get Medicines reviewed and adjusted.  Please request your Prim.MD to go over all Hospital Tests and Procedure/Radiological results at the follow up, please get all Hospital records sent to your Prim MD by signing hospital release before you go home.   If you experience worsening of your admission symptoms, develop shortness of breath, life threatening emergency, suicidal or homicidal thoughts you must seek medical attention immediately by calling 911 or calling your MD immediately  if symptoms less severe.  You Must read complete instructions/literature along with all the possible adverse reactions/side effects for all the Medicines you take and that have been prescribed to you. Take any new Medicines after you have completely understood and accpet all the possible adverse reactions/side effects.   Do not drive and provide baby sitting services if your were admitted for syncope or siezures until you have seen by Primary MD  or a Neurologist and advised to do so again.  Do not drive when taking Pain medications.    Do not take more than prescribed Pain, Sleep and Anxiety Medications  Special Instructions: If you have smoked or chewed Tobacco  in the last 2 yrs please stop smoking, stop any regular Alcohol  and or any Recreational drug use.  Wear Seat belts while driving.   Please note  You were cared for by a hospitalist during your hospital stay. If you have any questions about your discharge medications or the care you received while you were in the hospital after you are discharged, you can call the unit and asked to speak with the hospitalist on call if the hospitalist that took care of you is not available. Once you are discharged, your primary care physician will handle  any further medical issues. Please note that NO REFILLS for any discharge medications will be authorized once you are discharged, as it is imperative that you return to your primary care physician (or establish a relationship with a primary care physician if you do not have one) for your aftercare needs so that they can reassess your need for medications and monitor your lab values.     Increase activity slowly    Complete by:  As directed             Medication List    TAKE these medications       acetaminophen 325 MG tablet  Commonly known as:  TYLENOL  Take 650 mg by mouth every 6 (six) hours as needed for fever.     aspirin 81 MG chewable tablet  Chew 81 mg by mouth daily.     calcium carbonate 500 MG chewable tablet  Commonly known as:  TUMS - dosed in mg elemental calcium  Chew 1,000 mg by mouth every 4 (four) hours as needed for indigestion or heartburn.     cefUROXime 500 MG tablet  Commonly known as:  CEFTIN  Take 1 tablet (500 mg total) by mouth 2 (two) times daily with a meal. Use for 3 more days     Cranberry Fruit 475 MG Caps  Take 475 mg by mouth 2 (two) times daily. May open and mix with food     docusate sodium 100 MG capsule  Commonly known as:  COLACE  Take 1 capsule (100 mg total) by mouth daily.     HYDROcodone-acetaminophen 5-325 MG per tablet  Commonly known as:  NORCO/VICODIN  Take 1 tablet by mouth every 6 (six) hours as needed for moderate pain.     ibuprofen 100 MG/5ML suspension  Commonly known as:  ADVIL,MOTRIN  Take 400 mg by mouth every 6 (six) hours as needed for mild pain.     levothyroxine 100 MCG tablet  Commonly known as:  SYNTHROID, LEVOTHROID  Take 1 tablet (100 mcg total) by mouth daily before breakfast.     loratadine 10 MG tablet  Commonly known as:  CLARITIN  Take 10 mg by mouth daily as needed for allergies.     Melatonin 5 MG Tabs  Take 5 mg by mouth at bedtime as needed (sleep).     memantine 10 MG tablet  Commonly  known as:  NAMENDA  Take 10 mg by mouth 2 (two) times daily.     MULTIVITAMIN GUMMIES ADULT PO  Take 1 each by mouth daily.     POLYETHYLENE GLYCOL 3350 PO  Take 0.5 each by mouth daily. Patient takes a  1/2 capful in juice of choice     Vitamin D3 1000 UNITS Chew  Chew 1 tablet by mouth 3 (three) times daily.      ASK your doctor about these medications       rosuvastatin 20 MG tablet  Commonly known as:  CRESTOR  Take 1 tablet (20 mg total) by mouth daily.       Allergies  Allergen Reactions  . Lorazepam   . Sulfa Antibiotics       The results of significant diagnostics from this hospitalization (including imaging, microbiology, ancillary and laboratory) are listed below for reference.    Significant Diagnostic Studies: Dg Chest 2 View  04/18/2014   CLINICAL DATA:  Unresponsive patient.  EXAM: CHEST  2 VIEW  COMPARISON:  12/12/2012  FINDINGS: Cardiac silhouette is mildly enlarged. Normal mediastinal and hilar contours.  Lungs are clear.  No pleural effusion or pneumothorax.  Bony thorax is demineralized but intact.  IMPRESSION: No acute cardiopulmonary disease.   Electronically Signed   By: Amie Portlandavid  Ormond M.D.   On: 04/18/2014 14:22   Dg Abd 1 View  04/19/2014   CLINICAL DATA:  Abdominal pain  EXAM: ABDOMEN - 1 VIEW  COMPARISON:  None.  FINDINGS: Scattered large and small bowel gas is noted. No obstructive changes are seen. Fecal material is noted within the rectum without definitive impaction. Degenerative change of the lumbar spine is seen.  IMPRESSION: No acute abnormality noted.   Electronically Signed   By: Alcide CleverMark  Lukens M.D.   On: 04/19/2014 08:35    Microbiology: Recent Results (from the past 240 hour(s))  URINE CULTURE     Status: None   Collection Time    04/18/14  1:40 PM      Result Value Ref Range Status   Specimen Description URINE, CATHETERIZED   Final   Special Requests NONE   Final   Culture  Setup Time     Final   Value: 04/19/2014 01:32     Performed  at [facility]   Colony Count     Final   Value: >=100,000 COLONIES/ML     [facility]formed at [facility]   Culture     Final   Value: ESCHERICHIA COLI     Performed at [facility]   Report Status PENDING   Incomplete  MRSA PCR SCREENING     Status: None   Collection Time    04/18/14  6:04 PM      Result Value Ref Range Status   MRSA by PCR NEGATIVE  NEGATIVE Final   Comment:            The GeneXpert MRSA Assay (FDA     approved for NASAL specimens     only), is one component of a     comprehensive MRSA colonization     surveillance program. It is not     intended to diagnose MRSA     infection nor to guide or     monitor treatment for     MRSA infections.     Labs: Basic Metabolic Panel:  Recent Labs Lab 04/18/14 1325 04/19/14 0500  NA 142 145  K 3.5* 3.8  CL 106 109  CO2 21 20  GLUCOSE 142* 94  BUN 18 12  CREATININE 0.90 0.82  CALCIUM 9.4 8.7   Liver Function Tests:  Recent Labs Lab 04/18/14 1325  AST 21  ALT 19  ALKPHOS 57  BILITOT 0.4  PROT 6.9  ALBUMIN 3.5   CBC:  Recent Labs Lab 04/18/14 1325 04/19/14 0500  WBC 12.2* 6.4  NEUTROABS 11.0*  --   HGB 12.6 11.9*  HCT 36.9 36.0  MCV 91.6 92.5  PLT 108* 85*     Signed:    [facility] 04/20/2014, 8:30 AM

## 2014-04-20 LAB — URINE CULTURE

## 2014-04-20 MED ORDER — DOCUSATE SODIUM 100 MG PO CAPS: 100.0000 mg | ORAL_CAPSULE | Freq: Every day | ORAL | Status: DC

## 2014-04-20 MED ORDER — HYDROCODONE-ACETAMINOPHEN 5-325 MG PO TABS: 1.0000 | ORAL_TABLET | Freq: Four times a day (QID) | ORAL | Status: DC | PRN

## 2014-04-20 MED ORDER — CEFUROXIME AXETIL 500 MG PO TABS: 500.0000 mg | ORAL_TABLET | Freq: Two times a day (BID) | ORAL | Status: DC

## 2014-04-20 NOTE — Clinical Social Work Note (Signed)
CSW made aware by RN Joellen Jersey) of patient's d/c to [facility]. CSW informed patient's son Aaron Edelman) currently in room. CSW met with patient's son who is agreeable with d/c plan. CSW contacted [employer] and confirmed patient's return to facility. CSW faxed d/c summary to facility and provided RN with report and room number. Per Aaron Edelman, patient will be transported to facility by his sister. CSW made RN aware. No further needs. CSW signing off.   Marquez, [place] [facility] Clinical Social Worker [phone]

## 2014-04-20 NOTE — Progress Notes (Signed)
Nsg Discharge Note  Admit Date:  04/18/2014 Discharge date: 04/20/2014   Leighton Ruff [facility] to be D/C'd Nursing Home per MD order.  AVS completed.  Copy for chart, and copy for patient signed, and dated. Patient/caregiver able to verbalize understanding.  Discharge Medication:   Medication List         acetaminophen 325 MG tablet  Commonly known as:  TYLENOL  Take 650 mg by mouth every 6 (six) hours as needed for fever.     aspirin 81 MG chewable tablet  Chew 81 mg by mouth daily.     calcium carbonate 500 MG chewable tablet  Commonly known as:  TUMS - dosed in mg elemental calcium  Chew 1,000 mg by mouth every 4 (four) hours as needed for indigestion or heartburn.     cefUROXime 500 MG tablet  Commonly known as:  CEFTIN  Take 1 tablet (500 mg total) by mouth 2 (two) times daily with a meal. Use for 3 more days     Cranberry Fruit 475 MG Caps  Take 475 mg by mouth 2 (two) times daily. May open and mix with food     docusate sodium 100 MG capsule  Commonly known as:  COLACE  Take 1 capsule (100 mg total) by mouth daily.     HYDROcodone-acetaminophen 5-325 MG per tablet  Commonly known as:  NORCO/VICODIN  Take 1 tablet by mouth every 6 (six) hours as needed for moderate pain.     ibuprofen 100 MG/5ML suspension  Commonly known as:  ADVIL,MOTRIN  Take 400 mg by mouth every 6 (six) hours as needed for mild pain.     levothyroxine 100 MCG tablet  Commonly known as:  SYNTHROID, LEVOTHROID  Take 1 tablet (100 mcg total) by mouth daily before breakfast.     loratadine 10 MG tablet  Commonly known as:  CLARITIN  Take 10 mg by mouth daily as needed for allergies.     Melatonin 5 MG Tabs  Take 5 mg by mouth at bedtime as needed (sleep).     memantine 10 MG tablet  Commonly known as:  NAMENDA  Take 10 mg by mouth 2 (two) times daily.     MULTIVITAMIN GUMMIES ADULT PO  Take 1 each by mouth daily.     POLYETHYLENE GLYCOL 3350 PO  Take 0.5 each by mouth daily. Patient  takes a 1/2 capful in juice of choice     rosuvastatin 20 MG tablet  Commonly known as:  CRESTOR  Take 1 tablet (20 mg total) by mouth daily.     Vitamin D3 1000 UNITS Chew  Chew 1 tablet by mouth 3 (three) times daily.        Discharge Assessment: Filed Vitals:   04/20/14 0508  BP: 135/69  Pulse: 80  Temp: 98 F (36.7 C)  Resp: 18   Skin clean, dry and intact without evidence of skin break down, no evidence of skin tears noted. IV catheter discontinued intact. Site without signs and symptoms of complications - no redness or edema noted at insertion site, patient denies c/o pain - only slight tenderness at site.  Dressing with slight pressure applied.  D/c Instructions-Education: Discharge instructions given to patient/family with verbalized understanding. D/c education completed with patient/family including follow up instructions, medication list, d/c activities limitations if indicated, with other d/c instructions as indicated by MD - patient able to verbalize understanding, all questions fully answered. Patient instructed to return to ED, call 911, or call MD for  any changes in condition.  Patient escorted via WC, and D[facility]. Report called into [facility].  Kern ReapBrumagin, Arvis Miguez L, RN 04/20/2014 10:31 AM

## 2014-06-27 ENCOUNTER — Encounter (HOSPITAL_BASED_OUTPATIENT_CLINIC_OR_DEPARTMENT_OTHER): Payer: Self-pay | Admitting: Emergency Medicine

## 2014-06-27 ENCOUNTER — Emergency Department (HOSPITAL_BASED_OUTPATIENT_CLINIC_OR_DEPARTMENT_OTHER): Payer: Medicare Other

## 2014-06-27 ENCOUNTER — Emergency Department (HOSPITAL_BASED_OUTPATIENT_CLINIC_OR_DEPARTMENT_OTHER)
Admission: EM | Admit: 2014-06-27 | Discharge: 2014-06-27 | Disposition: A | Discharge status: Home / Self Care | Payer: Medicare Other | Source: Home / Self Care | Attending: Emergency Medicine | Admitting: Emergency Medicine

## 2014-06-27 DIAGNOSIS — IMO0002 Reserved for concepts with insufficient information to code with codable children: Secondary | ICD-10-CM

## 2014-06-27 DIAGNOSIS — G309 Alzheimer's disease, unspecified: Secondary | ICD-10-CM | POA: Diagnosis present

## 2014-06-27 DIAGNOSIS — Z791 Long term (current) use of non-steroidal anti-inflammatories (NSAID): Secondary | ICD-10-CM | POA: Insufficient documentation

## 2014-06-27 DIAGNOSIS — Z8679 Personal history of other diseases of the circulatory system: Secondary | ICD-10-CM

## 2014-06-27 DIAGNOSIS — S0990XA Unspecified injury of head, initial encounter: Secondary | ICD-10-CM | POA: Insufficient documentation

## 2014-06-27 DIAGNOSIS — Z8719 Personal history of other diseases of the digestive system: Secondary | ICD-10-CM | POA: Insufficient documentation

## 2014-06-27 DIAGNOSIS — Z87891 Personal history of nicotine dependence: Secondary | ICD-10-CM

## 2014-06-27 DIAGNOSIS — Z8709 Personal history of other diseases of the respiratory system: Secondary | ICD-10-CM

## 2014-06-27 DIAGNOSIS — M549 Dorsalgia, unspecified: Secondary | ICD-10-CM | POA: Diagnosis present

## 2014-06-27 DIAGNOSIS — M1710 Unilateral primary osteoarthritis, unspecified knee: Secondary | ICD-10-CM | POA: Insufficient documentation

## 2014-06-27 DIAGNOSIS — S1093XA Contusion of unspecified part of neck, initial encounter: Secondary | ICD-10-CM

## 2014-06-27 DIAGNOSIS — M129 Arthropathy, unspecified: Secondary | ICD-10-CM | POA: Diagnosis present

## 2014-06-27 DIAGNOSIS — E785 Hyperlipidemia, unspecified: Secondary | ICD-10-CM | POA: Insufficient documentation

## 2014-06-27 DIAGNOSIS — F0280 Dementia in other diseases classified elsewhere without behavioral disturbance: Secondary | ICD-10-CM | POA: Diagnosis present

## 2014-06-27 DIAGNOSIS — Z66 Do not resuscitate: Secondary | ICD-10-CM | POA: Diagnosis present

## 2014-06-27 DIAGNOSIS — W19XXXA Unspecified fall, initial encounter: Secondary | ICD-10-CM

## 2014-06-27 DIAGNOSIS — N390 Urinary tract infection, site not specified: Secondary | ICD-10-CM | POA: Diagnosis present

## 2014-06-27 DIAGNOSIS — Z792 Long term (current) use of antibiotics: Secondary | ICD-10-CM | POA: Insufficient documentation

## 2014-06-27 DIAGNOSIS — Z7982 Long term (current) use of aspirin: Secondary | ICD-10-CM | POA: Insufficient documentation

## 2014-06-27 DIAGNOSIS — G8929 Other chronic pain: Secondary | ICD-10-CM

## 2014-06-27 DIAGNOSIS — J9600 Acute respiratory failure, unspecified whether with hypoxia or hypercapnia: Secondary | ICD-10-CM | POA: Diagnosis present

## 2014-06-27 DIAGNOSIS — E872 Acidosis, unspecified: Secondary | ICD-10-CM | POA: Diagnosis not present

## 2014-06-27 DIAGNOSIS — D649 Anemia, unspecified: Secondary | ICD-10-CM | POA: Diagnosis present

## 2014-06-27 DIAGNOSIS — Z882 Allergy status to sulfonamides status: Secondary | ICD-10-CM

## 2014-06-27 DIAGNOSIS — Y9389 Activity, other specified: Secondary | ICD-10-CM | POA: Insufficient documentation

## 2014-06-27 DIAGNOSIS — Z888 Allergy status to other drugs, medicaments and biological substances status: Secondary | ICD-10-CM

## 2014-06-27 DIAGNOSIS — S0003XA Contusion of scalp, initial encounter: Secondary | ICD-10-CM

## 2014-06-27 DIAGNOSIS — G929 Unspecified toxic encephalopathy: Secondary | ICD-10-CM | POA: Diagnosis present

## 2014-06-27 DIAGNOSIS — S0083XA Contusion of other part of head, initial encounter: Secondary | ICD-10-CM

## 2014-06-27 DIAGNOSIS — J690 Pneumonitis due to inhalation of food and vomit: Secondary | ICD-10-CM | POA: Diagnosis not present

## 2014-06-27 DIAGNOSIS — E039 Hypothyroidism, unspecified: Secondary | ICD-10-CM | POA: Insufficient documentation

## 2014-06-27 DIAGNOSIS — M19079 Primary osteoarthritis, unspecified ankle and foot: Secondary | ICD-10-CM

## 2014-06-27 DIAGNOSIS — M19049 Primary osteoarthritis, unspecified hand: Secondary | ICD-10-CM | POA: Insufficient documentation

## 2014-06-27 DIAGNOSIS — Z8742 Personal history of other diseases of the female genital tract: Secondary | ICD-10-CM | POA: Insufficient documentation

## 2014-06-27 DIAGNOSIS — W1809XA Striking against other object with subsequent fall, initial encounter: Secondary | ICD-10-CM | POA: Insufficient documentation

## 2014-06-27 DIAGNOSIS — E860 Dehydration: Secondary | ICD-10-CM | POA: Diagnosis present

## 2014-06-27 DIAGNOSIS — R4182 Altered mental status, unspecified: Secondary | ICD-10-CM | POA: Diagnosis not present

## 2014-06-27 DIAGNOSIS — W1811XA Fall from or off toilet without subsequent striking against object, initial encounter: Secondary | ICD-10-CM

## 2014-06-27 DIAGNOSIS — G92 Toxic encephalopathy: Secondary | ICD-10-CM | POA: Diagnosis present

## 2014-06-27 DIAGNOSIS — M810 Age-related osteoporosis without current pathological fracture: Secondary | ICD-10-CM | POA: Diagnosis present

## 2014-06-27 DIAGNOSIS — R1310 Dysphagia, unspecified: Secondary | ICD-10-CM | POA: Diagnosis present

## 2014-06-27 DIAGNOSIS — Y92009 Unspecified place in unspecified non-institutional (private) residence as the place of occurrence of the external cause: Secondary | ICD-10-CM | POA: Insufficient documentation

## 2014-06-27 DIAGNOSIS — A498 Other bacterial infections of unspecified site: Secondary | ICD-10-CM | POA: Diagnosis present

## 2014-06-27 LAB — URINE MICROSCOPIC-ADD ON

## 2014-06-27 LAB — URINALYSIS, ROUTINE W REFLEX MICROSCOPIC
Bilirubin Urine: NEGATIVE
Glucose, UA: NEGATIVE mg/dL
Ketones, ur: NEGATIVE mg/dL
LEUKOCYTES UA: NEGATIVE
Nitrite: POSITIVE — AB
Protein, ur: NEGATIVE mg/dL
SPECIFIC GRAVITY, URINE: 1.023 (ref 1.005–1.030)
UROBILINOGEN UA: 0.2 mg/dL (ref 0.0–1.0)
pH: 6 (ref 5.0–8.0)

## 2014-06-27 MED ORDER — CEFUROXIME AXETIL 500 MG PO TABS
500.0000 mg | ORAL_TABLET | Freq: Once | ORAL | Status: DC
  Filled 2014-06-27: qty 1

## 2014-06-27 NOTE — ED Notes (Addendum)
Patent here today from nursing facility, fell on L hip and hit head. End stage alzheimers   Patients glucose 117 on ambulance, unable to communicate, at baseline

## 2014-06-27 NOTE — ED Notes (Signed)
MD at bedside. 

## 2014-06-27 NOTE — Discharge Instructions (Signed)
Continue take tylenol, motrin for pain.   Apply ice to scalp area.   Follow up with your doctor.   You will be called if urine culture is positive.   Return to ER if she has another fall, not behaving normally, vomiting.

## 2014-06-27 NOTE — ED Provider Notes (Signed)
CSN: [id number]     Arrival date & time 06/27/14  1645 History   First MD Initiated Contact with Patient 06/27/14 1647     Chief Complaint  Patient presents with  . Fall     (Consider location/radiation/quality/duration/timing/severity/associated sxs/prior Treatment) The history is provided by the EMS personnel. The history is limited by the condition of the patient.  Erika Warren is a 68 y.o. female hx of dementia, diverticulosis, here with fall. She was sitting on a commode and the aid turned around and she grabbed her shirt and fell to the floor head first. She had head injury, no LOC. Also may have injured right hip as well. As per family, her mental status is at baseline. She does get frequent UTIs and just finished a course of abx. She had some diarrhea several days ago but resolved.     Level V caveat- dementia   Past Medical History  Diagnosis Date  . Hypothyroid   . Allergic rhinitis   . Osteoporosis   . Menopause 2000  . Other and unspecified hyperlipidemia   . Tobacco use disorder   . Rectal polyp 04/2005  . Sigmoid diverticulosis   . Internal hemorrhoids   . Alzheimer's dementia     "late stage" (04/18/2014)  . Nonverbal   . Arthritis     "hands, legs, feet" (04/18/2014)  . Chronic lower back pain    Past Surgical History  Procedure Laterality Date  . Appendectomy    . Tonsillectomy and adenoidectomy    . Cesarean section  1979  . Mouth surgery  1990's    "don't know why"   No family history on file. History  Substance Use Topics  . Smoking status: Former Smoker -- 0.50 packs/day for 30 years    Types: Cigarettes    Quit date: 12/10/2007  . Smokeless tobacco: Never Used  . Alcohol Use: No     Comment: occ   OB History   Grav Para Term Preterm Abortions TAB SAB Ect Mult Living                 Review of Systems  Unable to perform ROS: Dementia      Allergies  Lorazepam and Sulfa antibiotics  Home Medications   Prior to Admission  medications   Medication Sig Start Date End Date Taking? Authorizing Provider  acetaminophen (TYLENOL) 325 MG tablet Take 650 mg by mouth every 6 (six) hours as needed for fever.    Historical Provider, MD  aspirin 81 MG chewable tablet Chew 81 mg by mouth daily.    Historical Provider, MD  calcium carbonate (TUMS - DOSED IN MG ELEMENTAL CALCIUM) 500 MG chewable tablet Chew 1,000 mg by mouth every 4 (four) hours as needed for indigestion or heartburn.    Historical Provider, MD  cefUROXime (CEFTIN) 500 MG tablet Take 1 tablet (500 mg total) by mouth 2 (two) times daily with a meal. Use for 3 more days 04/20/14   Leroy Sea, MD  Cholecalciferol (VITAMIN D3) 1000 UNITS CHEW Chew 1 tablet by mouth 3 (three) times daily.    Historical Provider, MD  Cranberry Fruit 475 MG CAPS Take 475 mg by mouth 2 (two) times daily. May open and mix with food    Historical Provider, MD  docusate sodium (COLACE) 100 MG capsule Take 1 capsule (100 mg total) by mouth daily. 04/20/14   Leroy Sea, MD  HYDROcodone-acetaminophen (NORCO/VICODIN) 5-325 MG per tablet Take 1 tablet by mouth every  6 (six) hours as needed for moderate pain. 04/20/14   Leroy SeaPrashant K Singh, MD  ibuprofen (ADVIL,MOTRIN) 100 MG/5ML suspension Take 400 mg by mouth every 6 (six) hours as needed for mild pain.    Historical Provider, MD  levothyroxine (SYNTHROID, LEVOTHROID) 100 MCG tablet Take 1 tablet (100 mcg total) by mouth daily before breakfast. 01/07/14   Christiane Haorinna L Sullivan, MD  loratadine (CLARITIN) 10 MG tablet Take 10 mg by mouth daily as needed for allergies.    Historical Provider, MD  Melatonin 5 MG TABS Take 5 mg by mouth at bedtime as needed (sleep).    Historical Provider, MD  memantine (NAMENDA) 10 MG tablet Take 10 mg by mouth 2 (two) times daily.    Historical Provider, MD  Multiple Vitamins-Minerals (MULTIVITAMIN GUMMIES ADULT PO) Take 1 each by mouth daily.    Historical Provider, MD  POLYETHYLENE GLYCOL 3350 PO Take 0.5 each by  mouth daily. Patient takes a 1/2 capful in juice of choice    Historical Provider, MD  rosuvastatin (CRESTOR) 20 MG tablet Take 1 tablet (20 mg total) by mouth daily. 04/28/12 04/28/13  Maurice MarchBarbara B McPherson, MD   BP 138/110  Pulse 100  Temp(Src) 98.3 F (36.8 C) (Rectal)  Resp 18  SpO2 97% Physical Exam  Nursing note and vitals reviewed. Constitutional:  Chronically ill, demented   HENT:  Mouth/Throat: Oropharynx is clear and moist.  + frontal scalp hematoma. No obvious lacerations   Eyes: Conjunctivae and EOM are normal. Pupils are equal, round, and reactive to light.  Neck: Normal range of motion. Neck supple.  Cardiovascular: Normal rate, regular rhythm and normal heart sounds.   Pulmonary/Chest: Effort normal and breath sounds normal. No respiratory distress. She has no wheezes. She has no rales.  Abdominal: Soft. Bowel sounds are normal. She exhibits no distension. There is no tenderness. There is no rebound and no guarding.  Musculoskeletal:  R knee splint in place. Minimal redness around L hip. Nl ROM bilateral hips   Neurological: She is alert.  Demented, moving all extremities   Skin: Skin is warm and dry.  Psychiatric:  Unable     ED Course  Procedures (including critical care time) Labs Review Labs Reviewed  URINALYSIS, ROUTINE W REFLEX MICROSCOPIC - Abnormal; Notable for the following:    APPearance CLOUDY (*)    Hgb urine dipstick SMALL (*)    Nitrite POSITIVE (*)    All other components within normal limits  URINE MICROSCOPIC-ADD ON - Abnormal; Notable for the following:    Bacteria, UA MANY (*)    All other components within normal limits  URINE CULTURE    Imaging Review Dg Hip Complete Left  06/27/2014   CLINICAL DATA:  Larey SeatFell on left hip and hit head in stage Alzheimer's.  EXAM: LEFT HIP - COMPLETE 2+ VIEW  COMPARISON:  None.  FINDINGS: Mild osteoarthritis involves the left hip. No fracture or dislocation. There is moderate degenerative joint disease  involving the right hip. Degenerative disc disease is identified in the lower lumbar spine.  IMPRESSION: 1. No acute findings. 2. Bilateral hip osteoarthritis 3. Lumbar degenerative disc disease.   Electronically Signed   By: Signa Kellaylor  Stroud M.D.   On: 06/27/2014 18:35   Ct Head Wo Contrast  06/27/2014   CLINICAL DATA:  Larey SeatFell.  Hit head.  EXAM: CT HEAD WITHOUT CONTRAST  TECHNIQUE: Contiguous axial images were obtained from the base of the skull through the vertex without intravenous contrast.  COMPARISON:  Multiple prior  head CTs.  FINDINGS: Examination is degraded by patient motion. There is stable cerebral atrophy, ventriculomegaly and periventricular white matter disease. No acute intracranial findings or mass lesions. No extra-axial fluid collections.  There is a frontal scalp hematoma but no underlying skull fracture.  IMPRESSION: Chronic changes in the brain but no acute findings.  Frontal scalp hematoma without underlying skull fracture.   Electronically Signed   By: Loralie Champagne M.D.   On: 06/27/2014 18:39     EKG Interpretation None      MDM   Final diagnoses:  None   Erika Warren is a 68 y.o. female here with fall. Will do CT head, xrays. Patient's family request UA given frequent UTIs. Doesn't appear septic. Will straight cath for UA.   6:46 PM CT head showed scalp hematoma, no fracture. UA ? UTI. She just finished a course of abx. Son wants to wait for culture before giving abx. Will send off culture.    Richardean Canal, MD 06/27/14 [phone]

## 2014-06-27 NOTE — ED Notes (Signed)
Placed ice pack on forehead

## 2014-06-30 ENCOUNTER — Encounter (HOSPITAL_COMMUNITY): Payer: Self-pay | Admitting: Emergency Medicine

## 2014-06-30 ENCOUNTER — Inpatient Hospital Stay (HOSPITAL_COMMUNITY)
Admission: EM | Admit: 2014-06-30 | Discharge: 2014-07-08 | DRG: 177 | Disposition: A | Discharge status: Skilled Nursing Facility | Payer: Medicare Other | Attending: Internal Medicine | Admitting: Internal Medicine

## 2014-06-30 ENCOUNTER — Emergency Department (HOSPITAL_COMMUNITY): Payer: Medicare Other

## 2014-06-30 DIAGNOSIS — E872 Acidosis, unspecified: Secondary | ICD-10-CM | POA: Diagnosis not present

## 2014-06-30 DIAGNOSIS — G929 Unspecified toxic encephalopathy: Secondary | ICD-10-CM | POA: Diagnosis present

## 2014-06-30 DIAGNOSIS — E860 Dehydration: Secondary | ICD-10-CM | POA: Diagnosis present

## 2014-06-30 DIAGNOSIS — J9602 Acute respiratory failure with hypercapnia: Secondary | ICD-10-CM

## 2014-06-30 DIAGNOSIS — M549 Dorsalgia, unspecified: Secondary | ICD-10-CM | POA: Diagnosis present

## 2014-06-30 DIAGNOSIS — Z87891 Personal history of nicotine dependence: Secondary | ICD-10-CM | POA: Diagnosis not present

## 2014-06-30 DIAGNOSIS — A498 Other bacterial infections of unspecified site: Secondary | ICD-10-CM | POA: Diagnosis present

## 2014-06-30 DIAGNOSIS — D649 Anemia, unspecified: Secondary | ICD-10-CM | POA: Diagnosis present

## 2014-06-30 DIAGNOSIS — J690 Pneumonitis due to inhalation of food and vomit: Secondary | ICD-10-CM | POA: Diagnosis present

## 2014-06-30 DIAGNOSIS — G9340 Encephalopathy, unspecified: Secondary | ICD-10-CM

## 2014-06-30 DIAGNOSIS — Z888 Allergy status to other drugs, medicaments and biological substances status: Secondary | ICD-10-CM | POA: Diagnosis not present

## 2014-06-30 DIAGNOSIS — Z66 Do not resuscitate: Secondary | ICD-10-CM | POA: Diagnosis present

## 2014-06-30 DIAGNOSIS — E869 Volume depletion, unspecified: Secondary | ICD-10-CM

## 2014-06-30 DIAGNOSIS — G8929 Other chronic pain: Secondary | ICD-10-CM | POA: Diagnosis present

## 2014-06-30 DIAGNOSIS — R4182 Altered mental status, unspecified: Secondary | ICD-10-CM

## 2014-06-30 DIAGNOSIS — M129 Arthropathy, unspecified: Secondary | ICD-10-CM | POA: Diagnosis present

## 2014-06-30 DIAGNOSIS — R1310 Dysphagia, unspecified: Secondary | ICD-10-CM | POA: Diagnosis present

## 2014-06-30 DIAGNOSIS — E038 Other specified hypothyroidism: Secondary | ICD-10-CM

## 2014-06-30 DIAGNOSIS — N390 Urinary tract infection, site not specified: Secondary | ICD-10-CM | POA: Diagnosis present

## 2014-06-30 DIAGNOSIS — Z882 Allergy status to sulfonamides status: Secondary | ICD-10-CM | POA: Diagnosis not present

## 2014-06-30 DIAGNOSIS — E785 Hyperlipidemia, unspecified: Secondary | ICD-10-CM

## 2014-06-30 DIAGNOSIS — F0280 Dementia in other diseases classified elsewhere without behavioral disturbance: Secondary | ICD-10-CM | POA: Diagnosis present

## 2014-06-30 DIAGNOSIS — M810 Age-related osteoporosis without current pathological fracture: Secondary | ICD-10-CM | POA: Diagnosis present

## 2014-06-30 DIAGNOSIS — G309 Alzheimer's disease, unspecified: Secondary | ICD-10-CM | POA: Diagnosis present

## 2014-06-30 DIAGNOSIS — F0390 Unspecified dementia without behavioral disturbance: Secondary | ICD-10-CM

## 2014-06-30 DIAGNOSIS — G92 Toxic encephalopathy: Secondary | ICD-10-CM | POA: Diagnosis present

## 2014-06-30 DIAGNOSIS — K5730 Diverticulosis of large intestine without perforation or abscess without bleeding: Secondary | ICD-10-CM

## 2014-06-30 DIAGNOSIS — Z7982 Long term (current) use of aspirin: Secondary | ICD-10-CM | POA: Diagnosis not present

## 2014-06-30 DIAGNOSIS — E039 Hypothyroidism, unspecified: Secondary | ICD-10-CM | POA: Diagnosis present

## 2014-06-30 DIAGNOSIS — J9601 Acute respiratory failure with hypoxia: Secondary | ICD-10-CM

## 2014-06-30 DIAGNOSIS — I429 Cardiomyopathy, unspecified: Secondary | ICD-10-CM

## 2014-06-30 DIAGNOSIS — K5900 Constipation, unspecified: Secondary | ICD-10-CM

## 2014-06-30 DIAGNOSIS — D72829 Elevated white blood cell count, unspecified: Secondary | ICD-10-CM

## 2014-06-30 DIAGNOSIS — Z791 Long term (current) use of non-steroidal anti-inflammatories (NSAID): Secondary | ICD-10-CM | POA: Diagnosis not present

## 2014-06-30 DIAGNOSIS — J9600 Acute respiratory failure, unspecified whether with hypoxia or hypercapnia: Secondary | ICD-10-CM | POA: Diagnosis present

## 2014-06-30 DIAGNOSIS — E870 Hyperosmolality and hypernatremia: Secondary | ICD-10-CM

## 2014-06-30 LAB — CBC WITH DIFFERENTIAL/PLATELET
Basophils Absolute: 0 10*3/uL (ref 0.0–0.1)
Basophils Relative: 0 % (ref 0–1)
Eosinophils Absolute: 0 10*3/uL (ref 0.0–0.7)
Eosinophils Relative: 0 % (ref 0–5)
HCT: 37.2 % (ref 36.0–46.0)
HEMOGLOBIN: 13 g/dL (ref 12.0–15.0)
LYMPHS PCT: 9 % — AB (ref 12–46)
Lymphs Abs: 1 10*3/uL (ref 0.7–4.0)
MCH: 31.6 pg (ref 26.0–34.0)
MCHC: 34.9 g/dL (ref 30.0–36.0)
MCV: 90.3 fL (ref 78.0–100.0)
MONOS PCT: 10 % (ref 3–12)
Monocytes Absolute: 1.1 10*3/uL — ABNORMAL HIGH (ref 0.1–1.0)
NEUTROS ABS: 9.2 10*3/uL — AB (ref 1.7–7.7)
Neutrophils Relative %: 81 % — ABNORMAL HIGH (ref 43–77)
Platelets: 154 10*3/uL (ref 150–400)
RBC: 4.12 MIL/uL (ref 3.87–5.11)
RDW: 12.8 % (ref 11.5–15.5)
WBC: 11.3 10*3/uL — ABNORMAL HIGH (ref 4.0–10.5)

## 2014-06-30 LAB — I-STAT CHEM 8, ED
BUN: 30 mg/dL — ABNORMAL HIGH (ref 6–23)
CREATININE: 0.7 mg/dL (ref 0.50–1.10)
Calcium, Ion: 1.09 mmol/L — ABNORMAL LOW (ref 1.13–1.30)
Chloride: 111 mEq/L (ref 96–112)
GLUCOSE: 114 mg/dL — AB (ref 70–99)
HCT: 43 % (ref 36.0–46.0)
Hemoglobin: 14.6 g/dL (ref 12.0–15.0)
POTASSIUM: 4.9 meq/L (ref 3.7–5.3)
Sodium: 141 mEq/L (ref 137–147)
TCO2: 24 mmol/L (ref 0–100)

## 2014-06-30 LAB — URINE MICROSCOPIC-ADD ON

## 2014-06-30 LAB — COMPREHENSIVE METABOLIC PANEL
ALK PHOS: 67 U/L (ref 39–117)
ALT: 19 U/L (ref 0–35)
AST: 18 U/L (ref 0–37)
Albumin: 3 g/dL — ABNORMAL LOW (ref 3.5–5.2)
Anion gap: 15 (ref 5–15)
BILIRUBIN TOTAL: 0.4 mg/dL (ref 0.3–1.2)
BUN: 21 mg/dL (ref 6–23)
CHLORIDE: 109 meq/L (ref 96–112)
CO2: 21 mEq/L (ref 19–32)
Calcium: 9.5 mg/dL (ref 8.4–10.5)
Creatinine, Ser: 0.71 mg/dL (ref 0.50–1.10)
GFR calc non Af Amer: 87 mL/min — ABNORMAL LOW (ref >90)
GLUCOSE: 119 mg/dL — AB (ref 70–99)
POTASSIUM: 3.7 meq/L (ref 3.7–5.3)
SODIUM: 145 meq/L (ref 137–147)
TOTAL PROTEIN: 6.9 g/dL (ref 6.0–8.3)

## 2014-06-30 LAB — URINALYSIS, ROUTINE W REFLEX MICROSCOPIC
GLUCOSE, UA: NEGATIVE mg/dL
KETONES UR: 15 mg/dL — AB
Nitrite: POSITIVE — AB
PH: 6.5 (ref 5.0–8.0)
Protein, ur: >300 mg/dL — AB
Specific Gravity, Urine: 1.037 — ABNORMAL HIGH (ref 1.005–1.030)
Urobilinogen, UA: 1 mg/dL (ref 0.0–1.0)

## 2014-06-30 LAB — T3: T3 TOTAL: 65.8 ng/dL — AB (ref 80.0–204.0)

## 2014-06-30 LAB — TSH: TSH: 0.1 u[IU]/mL — ABNORMAL LOW (ref 0.350–4.500)

## 2014-06-30 LAB — PROTIME-INR
INR: 1.13 (ref 0.00–1.49)
Prothrombin Time: 14.5 seconds (ref 11.6–15.2)

## 2014-06-30 LAB — I-STAT TROPONIN, ED: Troponin i, poc: 0 ng/mL (ref 0.00–0.08)

## 2014-06-30 LAB — CBG MONITORING, ED: GLUCOSE-CAPILLARY: 127 mg/dL — AB (ref 70–99)

## 2014-06-30 LAB — T4: T4 TOTAL: 11.9 ug/dL (ref 5.0–12.5)

## 2014-06-30 MED ORDER — CETYLPYRIDINIUM CHLORIDE 0.05 % MT LIQD
7.0000 mL | Freq: Two times a day (BID) | OROMUCOSAL | Status: DC
  Administered 2014-07-01 – 2014-07-07 (×14): 7 mL via OROMUCOSAL

## 2014-06-30 MED ORDER — MORPHINE SULFATE 2 MG/ML IJ SOLN
0.5000 mg | Freq: Once | INTRAMUSCULAR | Status: AC
  Administered 2014-06-30: 0.5 mg via INTRAVENOUS
  Filled 2014-06-30: qty 1

## 2014-06-30 MED ORDER — ATORVASTATIN CALCIUM 20 MG PO TABS
20.0000 mg | ORAL_TABLET | Freq: Every day | ORAL | Status: DC
  Filled 2014-06-30: qty 1

## 2014-06-30 MED ORDER — SODIUM CHLORIDE 0.9 % IV SOLN
INTRAVENOUS | Status: DC
  Administered 2014-06-30 – 2014-07-02 (×5): via INTRAVENOUS

## 2014-06-30 MED ORDER — ENOXAPARIN SODIUM 40 MG/0.4ML ~~LOC~~ SOLN
40.0000 mg | SUBCUTANEOUS | Status: DC
  Administered 2014-07-01 – 2014-07-03 (×3): 40 mg via SUBCUTANEOUS
  Filled 2014-06-30 (×6): qty 0.4

## 2014-06-30 MED ORDER — ONDANSETRON HCL 4 MG/2ML IJ SOLN: 4.0000 mg | Freq: Four times a day (QID) | INTRAMUSCULAR | Status: DC | PRN

## 2014-06-30 MED ORDER — MEMANTINE HCL 10 MG PO TABS
10.0000 mg | ORAL_TABLET | Freq: Two times a day (BID) | ORAL | Status: DC
  Filled 2014-06-30 (×3): qty 1

## 2014-06-30 MED ORDER — ACETAMINOPHEN 160 MG/5ML PO SUSP
500.0000 mg | Freq: Three times a day (TID) | ORAL | Status: DC
  Filled 2014-06-30: qty 20

## 2014-06-30 MED ORDER — SODIUM CHLORIDE 0.9 % IV BOLUS (SEPSIS)
500.0000 mL | Freq: Once | INTRAVENOUS | Status: AC
  Administered 2014-06-30: 500 mL via INTRAVENOUS

## 2014-06-30 MED ORDER — ASPIRIN 81 MG PO CHEW
81.0000 mg | CHEWABLE_TABLET | Freq: Every day | ORAL | Status: DC
  Filled 2014-06-30: qty 1

## 2014-06-30 MED ORDER — LEVOTHYROXINE SODIUM 100 MCG PO TABS
100.0000 ug | ORAL_TABLET | Freq: Every day | ORAL | Status: DC
  Filled 2014-06-30 (×3): qty 1

## 2014-06-30 MED ORDER — CEFTRIAXONE SODIUM 1 G IJ SOLR
1.0000 g | INTRAMUSCULAR | Status: DC
  Administered 2014-07-01 – 2014-07-03 (×3): 1 g via INTRAVENOUS
  Filled 2014-06-30 (×4): qty 10

## 2014-06-30 MED ORDER — CHLORHEXIDINE GLUCONATE 0.12 % MT SOLN
15.0000 mL | Freq: Two times a day (BID) | OROMUCOSAL | Status: DC
  Administered 2014-06-30 – 2014-07-08 (×15): 15 mL via OROMUCOSAL
  Filled 2014-06-30 (×20): qty 15

## 2014-06-30 MED ORDER — HYDROCODONE-ACETAMINOPHEN 5-325 MG PO TABS: 1.0000 | ORAL_TABLET | Freq: Four times a day (QID) | ORAL | Status: DC | PRN

## 2014-06-30 MED ORDER — ONDANSETRON HCL 4 MG PO TABS: 4.0000 mg | ORAL_TABLET | Freq: Four times a day (QID) | ORAL | Status: DC | PRN

## 2014-06-30 MED ORDER — DEXTROSE 5 % IV SOLN
1.0000 g | Freq: Once | INTRAVENOUS | Status: AC
  Administered 2014-06-30: 1 g via INTRAVENOUS
  Filled 2014-06-30: qty 10

## 2014-06-30 MED ORDER — ACETAMINOPHEN 325 MG RE SUPP
325.0000 mg | RECTAL | Status: DC | PRN
  Administered 2014-07-01: 325 mg via RECTAL
  Filled 2014-06-30 (×5): qty 1

## 2014-06-30 NOTE — ED Notes (Addendum)
Admitting MD at bedside.

## 2014-06-30 NOTE — ED Notes (Signed)
Patient transported to CT 

## 2014-06-30 NOTE — H&P (Signed)
[facility] History and Physical  Erika Warren [facility] ZOX:[id number] DOB: 10-Jul-1946 DOA: 06/30/2014  Referring physician: EDP PCP: Pcp Not In System   Chief Complaint:  Brought in from SNF for altered mental status.   HPI: Erika Warren is a 67 y.o. female lives in memory care unit, fell on Thursday and was brought to ED, underwent CT head was unremarkable, was brought in again to ED for altered mental status. She was found to have abnormal UA and mild leukocytosis on lab work. She was referred to medical service for admission. Most of the history available from the daughter and caregiver at bedside. Patient at basleine is non verbal, but ambulates short distances when able. She has advanced dementia and takes regular food . She will be admitted to med surg.    Review of Systems:  Could not be obtained.    Past Medical History  Diagnosis Date  . Hypothyroid   . Allergic rhinitis   . Osteoporosis   . Menopause 2000  . Other and unspecified hyperlipidemia   . Tobacco use disorder   . Rectal polyp 04/2005  . Sigmoid diverticulosis   . Internal hemorrhoids   . Alzheimer's dementia     "late stage" (04/18/2014)  . Nonverbal   . Arthritis     "hands, legs, feet" (04/18/2014)  . Chronic lower back pain    Past Surgical History  Procedure Laterality Date  . Appendectomy    . Tonsillectomy and adenoidectomy    . Cesarean section  1979  . Mouth surgery  1990's    "don't know why"   Social History:  reports that she quit smoking about 6 years ago. Her smoking use included Cigarettes. She has a 15 pack-year smoking history. She has never used smokeless tobacco. She reports that she does not drink alcohol or use illicit drugs.  Allergies  Allergen Reactions  . Lorazepam Other (See Comments)    Unknown   . Sulfa Antibiotics     Unknown     No family history on file.   Prior to Admission medications   Medication Sig Start Date End Date Taking? Authorizing  Provider  acetaminophen (TYLENOL) 160 MG/5ML suspension Take 500 mg by mouth 3 (three) times daily.   Yes Historical Provider, MD  aspirin 81 MG chewable tablet Chew 81 mg by mouth daily.   Yes Historical Provider, MD  Cholecalciferol (VITAMIN D3) 1000 UNITS CHEW Chew 1 tablet by mouth 3 (three) times daily.   Yes Historical Provider, MD  Cranberry Fruit 475 MG CAPS Take 475 mg by mouth 2 (two) times daily. May open and mix with food   Yes Historical Provider, MD  HYDROcodone-acetaminophen (NORCO/VICODIN) 5-325 MG per tablet Take 1 tablet by mouth every 6 (six) hours as needed for moderate pain. 04/20/14  Yes Leroy Sea, MD  ibuprofen (ADVIL,MOTRIN) 100 MG/5ML suspension Take 400 mg by mouth every 6 (six) hours as needed for mild pain.   Yes Historical Provider, MD  levothyroxine (SYNTHROID, LEVOTHROID) 100 MCG tablet Take 1 tablet (100 mcg total) by mouth daily before breakfast. 01/07/14  Yes Christiane Ha, MD  Melatonin 5 MG TABS Take 5 mg by mouth at bedtime as needed (sleep).   Yes Historical Provider, MD  memantine (NAMENDA) 10 MG tablet Take 10 mg by mouth 2 (two) times daily.   Yes Historical Provider, MD  Multiple Vitamins-Minerals (MULTIVITAMIN GUMMIES ADULT PO) Take 1 each by mouth daily.   Yes Historical Provider, MD  rosuvastatin (CRESTOR)  20 MG tablet Take 20 mg by mouth daily.   Yes Historical Provider, MD   Physical Exam: Filed Vitals:   06/30/14 1600 06/30/14 1715 06/30/14 1745 06/30/14 1816  BP: 145/76 116/76 128/82 115/62  Pulse: 103  95   Temp:      Resp: SpO2: 100% 98% 100% 99%    Wt Readings from Last 3 Encounters:  04/18/14 68.811 kg (151 lb 11.2 oz)  01/04/14 55.067 kg (121 lb 6.4 oz)  06/08/12 58.06 kg (128 lb)    General:  Appears calm and comfortable Eyes: left eye bruise. Bruise over the left forehead.  Neck: no LAD, masses or thyromegaly Cardiovascular: RRR, no m/r/g. No LE edema. Respiratory: CTA bilaterally, no w/r/r. Normal  respiratory effort. Abdomen: soft, ntnd Skin: no rash or induration seen on limited exam Musculoskeletal: grossly normal tone BUE/BLE Neurologic: pt is sleeping comfortably. Able to move alle xtremities when she wakes up.           Labs on Admission:  Basic Metabolic Panel:  Recent Labs Lab 06/30/14 1454 06/30/14 1547  NA 145 141  K 3.7 4.9  CL 109 111  CO2 21  --   GLUCOSE 119* 114*  BUN 21 30*  CREATININE 0.71 0.70  CALCIUM 9.5  --    Liver Function Tests:  Recent Labs Lab 06/30/14 1454  AST 18  ALT 19  ALKPHOS 67  BILITOT 0.4  PROT 6.9  ALBUMIN 3.0*   No results found for this basename: LIPASE, AMYLASE,  in the last 168 hours No results found for this basename: AMMONIA,  in the last 168 hours CBC:  Recent Labs Lab 06/30/14 1454 06/30/14 1547  WBC 11.3*  --   NEUTROABS 9.2*  --   HGB 13.0 14.6  HCT 37.2 43.0  MCV 90.3  --   PLT 154  --    Cardiac Enzymes: No results found for this basename: CKTOTAL, CKMB, CKMBINDEX, TROPONINI,  in the last 168 hours  BNP (last 3 results) No results found for this basename: PROBNP,  in the last 8760 hours CBG:  Recent Labs Lab 06/30/14 1358  GLUCAP 127*    Radiological Exams on Admission: Ct Head Wo Contrast  06/30/2014   CLINICAL DATA:  Altered mental status  EXAM: CT HEAD WITHOUT CONTRAST  TECHNIQUE: Contiguous axial images were obtained from the base of the skull through the vertex without intravenous contrast.  COMPARISON:  06/27/2014  FINDINGS: Prominence of the sulci and ventricles noted compatible with brain atrophy. There is no evidence for acute intracranial hemorrhage, infarct or mass. No abnormal fluid collections identified. The paranasal sinuses appear clear. The mastoid air cells are clear. The skull appears intact.  IMPRESSION: 1. No acute intracranial abnormalities. 2. Brain atrophy.   Electronically Signed   By: Signa Kell M.D.   On: 06/30/2014 13:24   Dg Chest Port 1 View  06/30/2014    CLINICAL DATA:  Altered mental status  EXAM: PORTABLE CHEST - 1 VIEW  COMPARISON:  April 18, 2014  FINDINGS: There is a 1.5 x 1.3 cm nodular opacity in the medial aspect of the right upper lobe. Elsewhere lungs are clear. Heart size and pulmonary vascularity are normal. No adenopathy. There is atherosclerotic change in the aorta. No bone lesions.  IMPRESSION: 1.5 x 1.3 cm nodular opacity in the medial aspect of the right upper lobe. Advise noncontrast enhanced chest CT to further assess.  Elsewhere, there is no edema or consolidation. No  demonstrable adenopathy.   Electronically Signed   By: Bretta Bang M.D.   On: 06/30/2014 15:10    EKG: sinus rhythm.  Assessment/Plan Active Problems:   Acute encephalopathy   UTI (urinary tract infection)   Acute encephalopathy: - probably secondary to UTI and fall.  - repeat CT head negative for acute bleed or stroke.  -start her on IV rocephin and IV fluids. Urine cultures and blood cultures.  - OT/PT evaluation.   Dementia: - resume home medications.   Abnormal TSH: Free t3 and t4 ordered.   DVT prophylaxis .  Code Status: DNR DVT Prophylaxis: lovenox.  Family Communication: daughter at bedside.  Disposition Plan: admit to medsurg.   Time spent: 55 min  [facility] Pager [phone]  **Disclaimer: This note may have been dictated with voice recognition software. Similar sounding words can inadvertently be transcribed and this note may contain transcription errors which may not have been corrected upon publication of note.**

## 2014-06-30 NOTE — Progress Notes (Signed)
Pt admitted to the unit at 1853. Pt mental status is lethargic, nonverbal. Pt oriented to room, staff, and call bell. Skin is intact. Full assessment charted in [facility]. Call bell within reach. Visitor guidelines reviewed w/ pt and/or family.

## 2014-06-30 NOTE — Progress Notes (Signed)
Received report from ED. Awaiting patient arrival. 

## 2014-06-30 NOTE — Progress Notes (Signed)
Spoke with MD regarding family request for PR Tylenol. Current order is for suspension and family feels patient is not alert enough for PO intake at this time. Also relayed family's request for Desitin ointment to be applied to patient's buttocks with cleansing as a moisture barrier. Will continue to monitor.

## 2014-06-30 NOTE — ED Provider Notes (Addendum)
TIME SEEN: 12:48 PM  CHIEF COMPLAINT: Altered mental status  HPI: Patient is a 68 y.o. F with history of hypothyroidism, advanced dementia [facility] who presents to the emergency department with altered mental status. Family reports that she is normally nonverbal but is ambulatory. She states she's been very drowsy and difficult to wake up for the past 4 days, worse today. She was here several days ago after she had a head injury and had normal CT of her head and cervical spine. No other new injury. No new medications. No fevers, cough, vomiting or diarrhea.   PCP is Herschel [place]  ROS: Unobtainable secondary to altered mental status  PAST MEDICAL HISTORY/PAST SURGICAL HISTORY:  Past Medical History  Diagnosis Date  . Hypothyroid   . Allergic rhinitis   . Osteoporosis   . Menopause 2000  . Other and unspecified hyperlipidemia   . Tobacco use disorder   . Rectal polyp 04/2005  . Sigmoid diverticulosis   . Internal hemorrhoids   . Alzheimer's dementia     "late stage" (04/18/2014)  . Nonverbal   . Arthritis     "hands, legs, feet" (04/18/2014)  . Chronic lower back pain     MEDICATIONS:  Prior to Admission medications   Medication Sig Start Date End Date Taking? Authorizing Provider  acetaminophen (TYLENOL) 325 MG tablet Take 650 mg by mouth every 6 (six) hours as needed for fever.    Historical Provider, MD  aspirin 81 MG chewable tablet Chew 81 mg by mouth daily.    Historical Provider, MD  calcium carbonate (TUMS - DOSED IN MG ELEMENTAL CALCIUM) 500 MG chewable tablet Chew 1,000 mg by mouth every 4 (four) hours as needed for indigestion or heartburn.    Historical Provider, MD  cefUROXime (CEFTIN) 500 MG tablet Take 1 tablet (500 mg total) by mouth 2 (two) times daily with a meal. Use for 3 more days 04/20/14   Leroy Sea, MD  Cholecalciferol (VITAMIN D3) 1000 UNITS CHEW Chew 1 tablet by mouth 3 (three) times daily.    Historical Provider, MD   Cranberry Fruit 475 MG CAPS Take 475 mg by mouth 2 (two) times daily. May open and mix with food    Historical Provider, MD  docusate sodium (COLACE) 100 MG capsule Take 1 capsule (100 mg total) by mouth daily. 04/20/14   Leroy Sea, MD  HYDROcodone-acetaminophen (NORCO/VICODIN) 5-325 MG per tablet Take 1 tablet by mouth every 6 (six) hours as needed for moderate pain. 04/20/14   Leroy Sea, MD  ibuprofen (ADVIL,MOTRIN) 100 MG/5ML suspension Take 400 mg by mouth every 6 (six) hours as needed for mild pain.    Historical Provider, MD  levothyroxine (SYNTHROID, LEVOTHROID) 100 MCG tablet Take 1 tablet (100 mcg total) by mouth daily before breakfast. 01/07/14   Christiane Ha, MD  loratadine (CLARITIN) 10 MG tablet Take 10 mg by mouth daily as needed for allergies.    Historical Provider, MD  Melatonin 5 MG TABS Take 5 mg by mouth at bedtime as needed (sleep).    Historical Provider, MD  memantine (NAMENDA) 10 MG tablet Take 10 mg by mouth 2 (two) times daily.    Historical Provider, MD  Multiple Vitamins-Minerals (MULTIVITAMIN GUMMIES ADULT PO) Take 1 each by mouth daily.    Historical Provider, MD  POLYETHYLENE GLYCOL 3350 PO Take 0.5 each by mouth daily. Patient takes a 1/2 capful in juice of choice    Historical Provider, MD  rosuvastatin (  CRESTOR) 20 MG tablet Take 1 tablet (20 mg total) by mouth daily. 04/28/12 04/28/13  Maurice March, MD    ALLERGIES:  Allergies  Allergen Reactions  . Lorazepam   . Sulfa Antibiotics     SOCIAL HISTORY:  History  Substance Use Topics  . Smoking status: Former Smoker -- 0.50 packs/day for 30 years    Types: Cigarettes    Quit date: 12/10/2007  . Smokeless tobacco: Never Used  . Alcohol Use: No     Comment: occ    FAMILY HISTORY: No family history on file.  EXAM: SpO2 97% CONSTITUTIONAL: Patient is very drowsy but will open her eyes and move all of her extremities with painful stimuli and then keep her eyes open for several  minutes. She is protecting her airway. HEAD: Normocephalic EYES: Conjunctivae clear, PERRL ENT: normal nose; no rhinorrhea; moist mucous membranes; pharynx without lesions noted NECK: Supple, no meningismus, no LAD  CARD: RRR; S1 and S2 appreciated; no murmurs, no clicks, no rubs, no gallops RESP: Normal chest excursion without splinting or tachypnea; breath sounds clear and equal bilaterally; no wheezes, no rhonchi, no rales,  ABD/GI: Normal bowel sounds; non-distended; soft, non-tender, no rebound, no guarding BACK:  The back appears normal and is non-tender to palpation, there is no CVA tenderness EXT: Normal ROM in all joints; non-tender to palpation; no edema; normal capillary refill; no cyanosis    SKIN: Normal color for age and race; warm NEURO: Moves all extremities equally, nonverbal at baseline, opens eyes spontaneously PSYCH: The patient's mood and manner are appropriate. Grooming and personal hygiene are appropriate.  MEDICAL DECISION MAKING: Patient here altered mental status. Concern for possible infection, dehydration, intracranial hemorrhage or stroke. We'll obtain labs, urine, chest x-ray, CT head. We'll give IV fluids.  ED PROGRESS: Patient has a nitrite-positive UTI. Culture pending. We'll give Rocephin. Head CT unremarkable.  CXR shows no infiltrate. Basic labs are unremarkable. We'll discuss with medicine for admission. Family updated with plan.   4:15 PM  D/w Dr. Blake Divine for admission to medical bed, in patient.   EKG Interpretation  Date/Time:  Sunday June 30 2014 13:37:43 EDT Ventricular Rate:  116 PR Interval:  153 QRS Duration: 85 QT Interval:  336 QTC Calculation: 467 R Axis:   79 Text Interpretation:  Sinus tachycardia Biatrial enlargement Borderline repolarization abnormality Confirmed by Yakir Wenke,  DO, Adrien Dietzman [phone]) on 06/30/2014 2:30:30 PM        Layla Maw Ralene Gasparyan, DO 06/30/14 1556  Lili Harts N Serrena Linderman, DO 06/30/14 1615

## 2014-06-30 NOTE — ED Notes (Signed)
68 yo female from [facility] memory care unit with Increase in Altered Mental Status. Per EMS pt feel Thurs and hit head went to [facility] had [facility]. Since Thurs pt has been lethargic and very sleepy per family. Hx of Dement/Alz, Chronic UTI recently finished course of antibiotc 2 weeks ago.  Pts baseline is non-verbal but will track with eyes. Vitals Stable.

## 2014-06-30 NOTE — ED Notes (Signed)
MD at bedside. 

## 2014-07-01 ENCOUNTER — Inpatient Hospital Stay (HOSPITAL_COMMUNITY): Payer: Medicare Other

## 2014-07-01 DIAGNOSIS — J9602 Acute respiratory failure with hypercapnia: Secondary | ICD-10-CM

## 2014-07-01 DIAGNOSIS — J9601 Acute respiratory failure with hypoxia: Secondary | ICD-10-CM | POA: Diagnosis present

## 2014-07-01 DIAGNOSIS — J9600 Acute respiratory failure, unspecified whether with hypoxia or hypercapnia: Secondary | ICD-10-CM

## 2014-07-01 DIAGNOSIS — D72829 Elevated white blood cell count, unspecified: Secondary | ICD-10-CM

## 2014-07-01 DIAGNOSIS — E869 Volume depletion, unspecified: Secondary | ICD-10-CM | POA: Diagnosis present

## 2014-07-01 LAB — CBC WITH DIFFERENTIAL/PLATELET
BASOS PCT: 0 % (ref 0–1)
Basophils Absolute: 0 10*3/uL (ref 0.0–0.1)
Eosinophils Absolute: 0.2 10*3/uL (ref 0.0–0.7)
Eosinophils Relative: 1 % (ref 0–5)
HEMATOCRIT: 39.8 % (ref 36.0–46.0)
HEMOGLOBIN: 13 g/dL (ref 12.0–15.0)
Lymphocytes Relative: 19 % (ref 12–46)
Lymphs Abs: 3.5 10*3/uL (ref 0.7–4.0)
MCH: 29.8 pg (ref 26.0–34.0)
MCHC: 32.7 g/dL (ref 30.0–36.0)
MCV: 91.3 fL (ref 78.0–100.0)
MONO ABS: 1.2 10*3/uL — AB (ref 0.1–1.0)
Monocytes Relative: 7 % (ref 3–12)
NEUTROS ABS: 13.3 10*3/uL — AB (ref 1.7–7.7)
Neutrophils Relative %: 73 % (ref 43–77)
Platelets: 230 10*3/uL (ref 150–400)
RBC: 4.36 MIL/uL (ref 3.87–5.11)
RDW: 12.8 % (ref 11.5–15.5)
WBC: 18.2 10*3/uL — ABNORMAL HIGH (ref 4.0–10.5)

## 2014-07-01 LAB — COMPREHENSIVE METABOLIC PANEL
ALBUMIN: 3 g/dL — AB (ref 3.5–5.2)
ALK PHOS: 71 U/L (ref 39–117)
ALT: 16 U/L (ref 0–35)
ANION GAP: 13 (ref 5–15)
AST: 15 U/L (ref 0–37)
BILIRUBIN TOTAL: 0.3 mg/dL (ref 0.3–1.2)
BUN: 19 mg/dL (ref 6–23)
CO2: 22 meq/L (ref 19–32)
CREATININE: 0.59 mg/dL (ref 0.50–1.10)
Calcium: 8.9 mg/dL (ref 8.4–10.5)
Chloride: 111 mEq/L (ref 96–112)
Glucose, Bld: 145 mg/dL — ABNORMAL HIGH (ref 70–99)
Potassium: 3.7 mEq/L (ref 3.7–5.3)
Sodium: 146 mEq/L (ref 137–147)
Total Protein: 7 g/dL (ref 6.0–8.3)

## 2014-07-01 LAB — LACTIC ACID, PLASMA: LACTIC ACID, VENOUS: 0.9 mmol/L (ref 0.5–2.2)

## 2014-07-01 LAB — BLOOD GAS, ARTERIAL
Acid-base deficit: 5.3 mmol/L — ABNORMAL HIGH (ref 0.0–2.0)
Bicarbonate: 23 mEq/L (ref 20.0–24.0)
Drawn by: 40415
FIO2: 100 %
O2 SAT: 98.6 %
PH ART: 7.113 — AB (ref 7.350–7.450)
Patient temperature: 98.6
TCO2: 25.3 mmol/L (ref 0–100)
pCO2 arterial: 75.1 mmHg (ref 35.0–45.0)
pO2, Arterial: 217 mmHg — ABNORMAL HIGH (ref 80.0–100.0)

## 2014-07-01 LAB — URINE CULTURE: Colony Count: >=100000

## 2014-07-01 LAB — T4, FREE: Free T4: 1.16 ng/dL (ref 0.80–1.80)

## 2014-07-01 LAB — MRSA PCR SCREENING: MRSA by PCR: NEGATIVE

## 2014-07-01 LAB — T3, FREE: T3, Free: 2 pg/mL — ABNORMAL LOW (ref 2.3–4.2)

## 2014-07-01 LAB — GLUCOSE, CAPILLARY: Glucose-Capillary: 126 mg/dL — ABNORMAL HIGH (ref 70–99)

## 2014-07-01 MED ORDER — IPRATROPIUM-ALBUTEROL 0.5-2.5 (3) MG/3ML IN SOLN
RESPIRATORY_TRACT | Status: DC
Start: 2014-07-01 — End: 2014-07-01
  Filled 2014-07-01: qty 3

## 2014-07-01 MED ORDER — ACETAMINOPHEN 650 MG RE SUPP
650.0000 mg | RECTAL | Status: DC
  Administered 2014-07-01: 650 mg via RECTAL
  Filled 2014-07-01 (×2): qty 1

## 2014-07-01 MED ORDER — IPRATROPIUM-ALBUTEROL 0.5-2.5 (3) MG/3ML IN SOLN
3.0000 mL | RESPIRATORY_TRACT | Status: DC | PRN
  Administered 2014-07-03 (×2): 3 mL via RESPIRATORY_TRACT
  Filled 2014-07-01 (×3): qty 3

## 2014-07-01 MED ORDER — MORPHINE SULFATE 2 MG/ML IJ SOLN
2.0000 mg | Freq: Once | INTRAMUSCULAR | Status: AC
  Administered 2014-07-01: 2 mg via INTRAVENOUS

## 2014-07-01 MED ORDER — IPRATROPIUM-ALBUTEROL 0.5-2.5 (3) MG/3ML IN SOLN
3.0000 mL | RESPIRATORY_TRACT | Status: DC
  Administered 2014-07-01 (×4): 3 mL via RESPIRATORY_TRACT
  Filled 2014-07-01 (×4): qty 3

## 2014-07-01 MED ORDER — LEVOTHYROXINE SODIUM 100 MCG IV SOLR
50.0000 ug | Freq: Every day | INTRAVENOUS | Status: DC
  Administered 2014-07-01 – 2014-07-05 (×5): 50 ug via INTRAVENOUS
  Filled 2014-07-01 (×6): qty 5

## 2014-07-01 MED ORDER — METOPROLOL TARTRATE 1 MG/ML IV SOLN
INTRAVENOUS | Status: AC
  Filled 2014-07-01: qty 10

## 2014-07-01 MED ORDER — METOPROLOL TARTRATE 1 MG/ML IV SOLN
10.0000 mg | Freq: Four times a day (QID) | INTRAVENOUS | Status: DC
  Administered 2014-07-01: 5 mg via INTRAVENOUS
  Administered 2014-07-01: 10 mg via INTRAVENOUS
  Administered 2014-07-02 (×2): 5 mg via INTRAVENOUS
  Administered 2014-07-03: 10 mg via INTRAVENOUS
  Administered 2014-07-03 (×2): 5 mg via INTRAVENOUS
  Administered 2014-07-03 – 2014-07-04 (×2): 10 mg via INTRAVENOUS
  Filled 2014-07-01 (×15): qty 10

## 2014-07-01 MED ORDER — MORPHINE SULFATE 2 MG/ML IJ SOLN
INTRAMUSCULAR | Status: AC
  Filled 2014-07-01: qty 1

## 2014-07-01 MED ORDER — ARTIFICIAL TEARS OP OINT: TOPICAL_OINTMENT | OPHTHALMIC | Status: DC | PRN

## 2014-07-01 NOTE — Progress Notes (Signed)
[facility]  Terria Mathes WPY:[id number] DOB: 1946-01-02 DOA: 06/30/2014 PCP: Pcp Not In System  Brief narrative: 68 year old female patient who is a resident of skilled nursing facility due to severe dementia. She has private sitters to assist in her care who have accompanied her to the hospital. Patient has experienced progressive mental status changes beginning Thursday 8/20. At that time she was brought to the emergency department where CT head was unremarkable so she was sent back to the nursing facility. She was returned once again to the emergency department on 06/30/2014 for altered mentation.  In the ER she was found to have an abnormal urinalysis and mild leukocytosis consistent with likely urinary tract infection. At baseline the patient was nonverbal but normally alert and with assistance is able to ambulate. She apparently was on a regular diet without dysphagia precautions prior to admission.  After admission nurses found the patient in acute distress noting she was tachycardic with agonal breathing and pallor. Her O2 sats were 70% on room air. She was placed on a nonrebreather mask. On-call M.D. evaluated the patient at the bedside and transfered the patient to the step down ICU. She did require one time nasotracheal suctioning at which time a mucoid plug was returned. She has remained somnolent despite these measures. ABG revealed respiratory acidosis with a PCO2 of 75. Her family was updated at that time.  HPI/Subjective: Patient somnolent and nonverbal. Sitter at bedside and updated [place] on patient's normal baseline status.  Assessment/Plan:    Acute toxic metabolic and hypercapnic encephalopathy -Multifactorial: Initially from infection and now from hypercarbia-follow as acute issues treated-n.p.o. until alert    Acute respiratory failure with hypoxia and hypercarbia -Continue BiPAP until alert    Recurrent E Coli UTI  -Continue  empiric antibiotic-followup on blood cx's and sensitivities for urine culture-recent pansensitive Escherichia coli urinary tract infection 06/27/2014, 04/18/2014 and 01/02/2014    Volume depletion -Continue IV fluid at 75 cc per hour    Hypothyroid -Continue Synthroid - TSH 0.100 with normal T4 and low T3 consistent with sick euthyroid syndrome    Dementia -too somnolent to continue home medication   DVT prophylaxis: Lovenox Code Status: DO NOT RESUSCITATE Family Communication: No family at bedside Disposition Plan/Expected LOS: Stepdown   Consultants: None  Procedures: None  Cultures: 8/23 blood cultures x2 8/23 urine culture  Antibiotics: Rocephin 8/23 >>  Objective: Blood pressure 96/50, pulse 97, temperature 98.6 F (37 C), temperature source Oral, resp. rate 19, weight 124 lb 12.5 oz (56.6 kg), SpO2 99.00%.  Intake/Output Summary (Last 24 hours) at 07/01/14 1251 Last data filed at 07/01/14 1100  Gross per 24 hour  Intake    500 ml  Output      0 ml  Net    500 ml   Exam: Gen: No acute respiratory distress-somnolent and not arousable Chest: Clear to auscultation bilaterally without wheezes, rhonchi or crackles, BiPAP with 40% FiO2 Cardiac: Regular occasionally tachycardic rate and rhythm, S1-S2, no rubs murmurs or gallops, no peripheral edema, no JVD Abdomen: Soft nontender nondistended without obvious hepatosplenomegaly, no ascites Extremities: Symmetrical in appearance without cyanosis, clubbing or effusion  Scheduled Meds:  Scheduled Meds: . antiseptic oral rinse  7 mL Mouth Rinse q12n4p  . aspirin  81 mg Oral Daily  . atorvastatin  20 mg Oral q1800  . cefTRIAXone (ROCEPHIN)  IV  1 g Intravenous Q24H  . chlorhexidine  15 mL Mouth Rinse BID  .  enoxaparin (LOVENOX) injection  40 mg Subcutaneous Q24H  . ipratropium-albuterol  3 mL Nebulization Q4H  . levothyroxine  50 mcg Intravenous QAC breakfast  . memantine  10 mg Oral BID  . morphine       Data  Reviewed: Basic Metabolic Panel:  Recent Labs Lab 06/30/14 1454 06/30/14 1547 07/01/14 0609  NA 145 141 146  K 3.7 4.9 3.7  CL 109 111 111  CO2 21  --  22  GLUCOSE 119* 114* 145*  BUN 21 30* 19  CREATININE 0.71 0.70 0.59  CALCIUM 9.5  --  8.9   Liver Function Tests:  Recent Labs Lab 06/30/14 1454 07/01/14 0609  AST 18 15  ALT 19 16  ALKPHOS 67 71  BILITOT 0.4 0.3  PROT 6.9 7.0  ALBUMIN 3.0* 3.0*   CBC:  Recent Labs Lab 06/30/14 1454 06/30/14 1547 07/01/14 0609  WBC 11.3*  --  18.2*  NEUTROABS 9.2*  --  13.3*  HGB 13.0 14.6 13.0  HCT 37.2 43.0 39.8  MCV 90.3  --  91.3  PLT 154  --  230   CBG:  Recent Labs Lab 06/30/14 1358 07/01/14 0638  GLUCAP 127* 126*    Recent Results (from the past 240 hour(s))  URINE CULTURE     Status: None   Collection Time    06/27/14  5:31 PM      Result Value Ref Range Status   Specimen Description URINE, CATHETERIZED   Final   Special Requests NONE   Final   Culture  Setup Time     Final   Value: 06/28/2014 13:21     Performed at [facility] Count     Final   Value: >=100,000 COLONIES/ML     Performed at [facility]   Culture     Final   Value: ESCHERICHIA COLI     Performed at [facility]   Report Status 07/01/2014 FINAL   Final   Organism ID, Bacteria ESCHERICHIA COLI   Final  CULTURE, BLOOD (ROUTINE X 2)     Status: None   Collection Time    06/30/14  2:54 PM      Result Value Ref Range Status   Specimen Description BLOOD LEFT HAND   Final   Special Requests BOTTLES DRAWN AEROBIC AND ANAEROBIC 6CC   Final   Culture  Setup Time     Final   Value: 06/30/2014 22:21     Performed at [facility]   Culture     Final   Value:        BLOOD CULTURE RECEIVED NO GROWTH TO DATE CULTURE WILL BE HELD FOR 5 DAYS BEFORE ISSUING A FINAL NEGATIVE REPORT     Performed at [facility]   Report Status PENDING   Incomplete  CULTURE, BLOOD (ROUTINE X 2)     Status: None    Collection Time    06/30/14  4:22 PM      Result Value Ref Range Status   Specimen Description BLOOD LEFT HAND   Final   Special Requests BOTTLES DRAWN AEROBIC AND ANAEROBIC 4 CC   Final   Culture  Setup Time     Final   Value: 06/30/2014 22:21     Performed at [facility]   Culture     Final   Value:        BLOOD CULTURE RECEIVED NO GROWTH TO DATE CULTURE WILL BE HELD FOR  5 DAYS BEFORE ISSUING A FINAL NEGATIVE REPORT     Performed at [facility]   Report Status PENDING   Incomplete  MRSA PCR SCREENING     Status: None   Collection Time    07/01/14  6:36 AM      Result Value Ref Range Status   MRSA by PCR NEGATIVE  NEGATIVE Final   Comment:            The GeneXpert MRSA Assay (FDA     approved for NASAL specimens     only), is one component of a     comprehensive MRSA colonization     surveillance program. It is not     intended to diagnose MRSA     infection nor to guide or     monitor treatment for     MRSA infections.     Studies:  Recent x-ray studies have been reviewed in detail by the Attending Physician  Time spent : 35 minutes  Junious Silk, ANP [facility] Office  [phone] Pager [phone]  **If unable to reach the above provider after paging please contact the Flow Manager @ [phone]  On-Call/Text Page:      [email or web address]      password [email or web address]  If 7PM-7AM, please contact night-coverage [email or web address] Password [email or web address] 07/01/2014, 12:51 PM   LOS: 1 day   I have personally examined this patient and reviewed the entire database. I have reviewed the above note, made any necessary editorial changes, and agree with its content.  Lonia Blood, MD [facility]

## 2014-07-01 NOTE — Progress Notes (Signed)
Family states they do not feel the patient needs to be placed on bed alarm. She is provided with 24 hour sitters "whose primary job is to see that she does not fall". No bed alarm placed on patient. Will continue to monitor.

## 2014-07-01 NOTE — Progress Notes (Signed)
Utilization review completed. Horris Speros, RN, BSN. 

## 2014-07-01 NOTE — Progress Notes (Signed)
Called to floor at 0545 per RN for pt with worsening SOB.  Original assessment per RN tonight pt baseline nonverbal advance dementia from SNF. Per RN pt found this morning with po2 70s on RA. NRB placed on patient and Dr.Woods called to bedside. Rapid Response requested to evaluate pt. Pt somnolent in bed, withdraws to pain and grimaces to suction only. Upon my arrival pt with increased WOB, lungs auscultated, crackles bilateral bases, moving very little air, po2 94-97% on NRB.  HR 130s , bp 147/93 rr 30s. NT suctioning done per request Dr.Wood, yielding moderate amount brown thick secretions. Family called per floor RN to update on pt condition, en route to hospital this AM. Pt to transfer to ICU for biapap support per Dr.Wood. Transferred to ICU room 2M09 .

## 2014-07-01 NOTE — Progress Notes (Addendum)
[name] [facility] Ms YIA:[id number] DOB: 10-08-1946 DOA: 06/30/2014 PCP: Pcp Not In System   Subj: Erika Warren is a 68 y.o.WF PMHx lives in memory care unit, fell on Thursday and was brought to ED, underwent CT head was unremarkable, was brought in again to ED for altered mental status. She was found to have abnormal UA and mild leukocytosis on lab work. She was referred to medical service for admission. Most of the history available from the daughter and caregiver at bedside. Patient at basleine is non verbal, but ambulates short distances when able. She has advanced dementia and takes regular food . She will be admitted to med surg.  8/24 paged to bedside secondary to patient going into respiratory distress. Upon arrival patient was agonal breathing, RR= 30+, heart rate~150.   Obj: Objective: VITAL SIGNS: Temp: 97.9 F (36.6 C) (08/24 0630) Temp src: Oral (08/24 0630) BP: 141/114 mmHg (08/24 0630) Pulse Rate: 129 (08/24 0640) SPO2; 98% on nonrebreather, 15 L/min FIO2: 100%   Intake/Output Summary (Last 24 hours) at 07/01/14 0654 Last data filed at 07/01/14 0600  Gross per 24 hour  Intake     75 ml  Output      0 ml  Net     75 ml     Exam: General: A./O. x0, in acute respiratory distress Lungs: Poor air movement throughout lung fields, diffuse expiratory rhonchi.  Cardiovascular: Tachycardic, Regular rhythm without murmur gallop or rub normal S1 and S2 Abdomen: Nontender, nondistended, soft, bowel sounds positive, no rebound, no ascites, no appreciable mass Extremities: No significant cyanosis, clubbing, or edema bilateral lower extremities   Procedure/Significant Events: 8/24 PCXR;Shallow inspiration; developing linear atelectasis in the right lung base    Culture 8/23 urine pending 8/24 blood x2 pending   Antibiotics: Ceftriaxone 8/24>>   A/P Acute respiratory failure with hypercarbia -Continue patient on BiPAP -  Leukocytosis -Continue to treat  presumptive UTI with ceftriaxone; will hold on broadening antibiotic at this time -Blood culture x2 pending  UTI? -See leukocytosis

## 2014-07-01 NOTE — Progress Notes (Signed)
Patient having HR in 140s-180s sustained on tele. RN went to check on patient and found patient to be experiencing agonal breathing, and pale. O2 sats checked and were 70% on RA. Patient placed on NRB and saturation increased to 98% within 1-2 minutes. On-call provider, Rapid Response and Respiratory therapy called. See Rapid Response note. Patient transferred to 2MW09, family (son, [name]) notified.

## 2014-07-01 NOTE — Progress Notes (Signed)
INITIAL NUTRITION ASSESSMENT  DOCUMENTATION CODES Per approved criteria  -Not Applicable   INTERVENTION: - Once diet can be upgraded, recommend Resource Breeze po BID, each supplement provides 250 kcal and 9 grams of protein - RD will continue to monitor for nutrition care plan.  NUTRITION DIAGNOSIS: Inadequate oral intake related to inability to eat as evidenced by NPO.   Goal: Pt to meet >/= 90% of their estimated nutrition needs   Monitor:  Weight trend, NPO, acceptance of supplements, labs, SLP notes  Reason for Assessment: MST  68 y.o. female  Admitting Dx: <principal problem not specified>  ASSESSMENT: 68 y.o. female lives in memory care unit, fell on Thursday and was brought to ED, underwent CT head was unremarkable, was brought in again to ED for altered mental status.   - Pt currently on BiPaP - Caregiver was in room and provided nutritional history. She says that pt usually is a good eater, and has been eating regular food. She receives "protein ice cream" at SNF. She was drinking Psychologist, counselling by her family until she started gaining weight. Per caregiver, pt normally does fine with thin liquids.  - Pt to be evaluated by SLP before being given diet.  Labs: CBGs- 97-127 Na, K, BUN WNL  Height: Ht Readings from Last 1 Encounters:  04/18/14  (1.676 m)    Weight: Wt Readings from Last 1 Encounters:  07/01/14 124 lb 12.5 oz (56.6 kg)    Ideal Body Weight: 130 lb  % Ideal Body Weight: 95%  Wt Readings from Last 10 Encounters:  07/01/14 124 lb 12.5 oz (56.6 kg)  04/18/14 151 lb 11.2 oz (68.811 kg)  01/04/14 121 lb 6.4 oz (55.067 kg)  06/08/12 128 lb (58.06 kg)  04/28/12 125 lb (56.7 kg)  12/30/11 119 lb 9.6 oz (54.25 kg)    Usual Body Weight: 131 lbs per caregiver  % Usual Body Weight: 95%  BMI:  Body mass index is 20.15 kg/(m^2).  Estimated Nutritional Needs: Kcal: 1400-1600 Protein: 75-85 g Fluid: 1.6 L/day  Skin:  Intact  Diet Order: NPO  EDUCATION NEEDS: -Education not appropriate at this time   Intake/Output Summary (Last 24 hours) at 07/01/14 1306 Last data filed at 07/01/14 1100  Gross per 24 hour  Intake    500 ml  Output      0 ml  Net    500 ml    Last BM: 8/24   Labs:   Recent Labs Lab 06/30/14 1454 06/30/14 1547 07/01/14 0609  NA 145 141 146  K 3.7 4.9 3.7  CL 109 111 111  CO2 21  --  22  BUN 21 30* 19  CREATININE 0.71 0.70 0.59  CALCIUM 9.5  --  8.9  GLUCOSE 119* 114* 145*    CBG (last 3)   Recent Labs  06/30/14 1358 07/01/14 0638  GLUCAP 127* 126*    Scheduled Meds: . antiseptic oral rinse  7 mL Mouth Rinse q12n4p  . cefTRIAXone (ROCEPHIN)  IV  1 g Intravenous Q24H  . chlorhexidine  15 mL Mouth Rinse BID  . enoxaparin (LOVENOX) injection  40 mg Subcutaneous Q24H  . ipratropium-albuterol  3 mL Nebulization Q4H  . levothyroxine  50 mcg Intravenous QAC breakfast  . morphine        Continuous Infusions: . sodium chloride 75 mL/hr at 07/01/14 0600    Past Medical History  Diagnosis Date  . Hypothyroid   . Allergic rhinitis   . Osteoporosis   .  Menopause 2000  . Other and unspecified hyperlipidemia   . Tobacco use disorder   . Rectal polyp 04/2005  . Sigmoid diverticulosis   . Internal hemorrhoids   . Alzheimer's dementia     "late stage" (04/18/2014)  . Nonverbal   . Arthritis     "hands, legs, feet" (04/18/2014)  . Chronic lower back pain     Past Surgical History  Procedure Laterality Date  . Appendectomy    . Tonsillectomy and adenoidectomy    . Cesarean section  1979  . Mouth surgery  1990's    "don't know why"    Ebbie Latus RD, LDN

## 2014-07-01 NOTE — Progress Notes (Signed)
Chaplain responded to spiritual care consult. Pt was sleeping, spoke with pt's son. Pt had requested a Catholic communion before respiratory distress that brought her to ICU. Pt received Catholic visit and prayer this morning. She is unable to take communion at this time with breathing ask. Family aware of chaplain services, will request further support through RN. They appreciated visit.   Maurene Capes [phone]

## 2014-07-01 NOTE — Progress Notes (Signed)
**Note De-Identified  Obfuscation** Patient tolerating break from BIPAP; 4L [place], VS WNL

## 2014-07-02 DIAGNOSIS — E038 Other specified hypothyroidism: Secondary | ICD-10-CM

## 2014-07-02 LAB — URINE CULTURE: Colony Count: >=100000

## 2014-07-02 LAB — BASIC METABOLIC PANEL
ANION GAP: 13 (ref 5–15)
BUN: 20 mg/dL (ref 6–23)
CALCIUM: 9 mg/dL (ref 8.4–10.5)
CO2: 20 mEq/L (ref 19–32)
Chloride: 114 mEq/L — ABNORMAL HIGH (ref 96–112)
Creatinine, Ser: 0.64 mg/dL (ref 0.50–1.10)
GFR calc Af Amer: >90 mL/min (ref >90)
GFR calc non Af Amer: 90 mL/min — ABNORMAL LOW (ref >90)
Glucose, Bld: 104 mg/dL — ABNORMAL HIGH (ref 70–99)
POTASSIUM: 3.8 meq/L (ref 3.7–5.3)
Sodium: 147 mEq/L (ref 137–147)

## 2014-07-02 LAB — COMPREHENSIVE METABOLIC PANEL
ALT: 18 U/L (ref 0–35)
AST: 25 U/L (ref 0–37)
Albumin: 2.4 g/dL — ABNORMAL LOW (ref 3.5–5.2)
Alkaline Phosphatase: 65 U/L (ref 39–117)
Anion gap: 14 (ref 5–15)
BUN: 16 mg/dL (ref 6–23)
CO2: 20 meq/L (ref 19–32)
Calcium: 9.3 mg/dL (ref 8.4–10.5)
Chloride: 116 mEq/L — ABNORMAL HIGH (ref 96–112)
Creatinine, Ser: 0.6 mg/dL (ref 0.50–1.10)
GFR calc Af Amer: >90 mL/min (ref >90)
Glucose, Bld: 91 mg/dL (ref 70–99)
Potassium: 3.5 mEq/L — ABNORMAL LOW (ref 3.7–5.3)
SODIUM: 150 meq/L — AB (ref 137–147)
Total Bilirubin: 0.3 mg/dL (ref 0.3–1.2)
Total Protein: 6.3 g/dL (ref 6.0–8.3)

## 2014-07-02 LAB — CBC
HCT: 34 % — ABNORMAL LOW (ref 36.0–46.0)
Hemoglobin: 11.4 g/dL — ABNORMAL LOW (ref 12.0–15.0)
MCH: 29.7 pg (ref 26.0–34.0)
MCHC: 33.5 g/dL (ref 30.0–36.0)
MCV: 88.5 fL (ref 78.0–100.0)
PLATELETS: 157 10*3/uL (ref 150–400)
RBC: 3.84 MIL/uL — AB (ref 3.87–5.11)
RDW: 12.8 % (ref 11.5–15.5)
WBC: 9.1 10*3/uL (ref 4.0–10.5)

## 2014-07-02 LAB — GLUCOSE, CAPILLARY: Glucose-Capillary: 86 mg/dL (ref 70–99)

## 2014-07-02 MED ORDER — ACETAMINOPHEN 650 MG RE SUPP
650.0000 mg | RECTAL | Status: DC
  Administered 2014-07-02 – 2014-07-05 (×18): 650 mg via RECTAL
  Filled 2014-07-02 (×28): qty 1

## 2014-07-02 NOTE — Progress Notes (Signed)
[facility]  Erika Warren GUY:[id number] DOB: [birth year] DOA: 06/30/2014 PCP: Pcp Not In System  Brief narrative: 68 year old female patient who is a resident of skilled nursing facility due to severe dementia. She has private sitters to assist in her care who have accompanied her to the hospital. Patient has experienced progressive mental status changes beginning Thursday 8/20. At that time she was brought to the emergency department where CT head was unremarkable so she was sent back to the nursing facility. She was returned once again to the emergency department on 06/30/2014 for altered mentation.  In the ER she was found to have an abnormal urinalysis and mild leukocytosis consistent with likely urinary tract infection. At baseline the patient was nonverbal but normally alert and with assistance is able to ambulate. She apparently was on a regular diet without dysphagia precautions prior to admission.  After admission nurses found the patient in acute distress noting she was tachycardic with agonal breathing and pallor. Her O2 sats were 70% on room air. She was placed on a nonrebreather mask. On-call M.D. evaluated the patient at the bedside and transfered the patient to the step down ICU. She did require one time nasotracheal suctioning at which time a mucoid plug was returned. She has remained somnolent despite these measures. ABG revealed respiratory acidosis with a PCO2 of 75. Her family was updated at that time.  HPI/Subjective: Sleepy but more alert than yesterday-son at bedside and says patient can sleep up to 14 hours a day at baseline per him she's not quite back to baseline  Assessment/Plan:    Acute toxic metabolic and hypercapnic encephalopathy -Multifactorial: Initially from infection and now from hypercarbia-follow as acute issues treated-n.p.o. (see below)    Acute respiratory failure with hypoxia and hypercarbia -Continue BiPAP  prn- had issues with transient hypoxia overnight-cont cannula oxygen    Recurrent E Coli UTI  -Continue Rocephin-resistant to Septra-followup on blood cx's -recent pansensitive Escherichia coli urinary tract infection 06/27/2014, 04/18/2014 and 01/02/2014    Volume depletion -Continue IV fluid at 75 cc per hour    Hypothyroid -Continue Synthroid - TSH 0.100 with normal T4 and low T3 consistent with sick euthyroid syndrome    Dementia -too somnolent to continue home medication-SLP eval rec NPO for now-per son at baseline she eats regualr diet with thin liquids and uses a straw-also chews all meds   OA -on scheduled Tylenol 500 mg PO QID at SNF- pt becomes very sleepy with narcs and Celebrex so son has asked to avoid-does ok with Ibuprofen   DVT prophylaxis: Lovenox Code Status: DO NOT RESUSCITATE Family Communication: Son at bedside who is also POA Disposition Plan/Expected LOS: Stepdown   Consultants: None  Procedures: None  Cultures: 8/23 blood cultures x 2 pending 8/23 urine culture E. Coli resistant to Septra  Antibiotics: Rocephin 8/23 >>  Objective: Blood pressure 111/74, pulse 93, temperature 97.8 F (36.6 C), temperature source Oral, resp. rate 19, height  (1.651 m), weight 124 lb 12.5 oz (56.6 kg), SpO2 100.00%.  Intake/Output Summary (Last 24 hours) at 07/02/14 1359 Last data filed at 07/02/14 1300  Gross per 24 hour  Intake   1695 ml  Output      0 ml  Net   1695 ml   Exam: Gen: No acute respiratory distress-sleepy but more alert than 8/24 Chest: Clear to auscultation bilaterally without wheezes, rhonchi or crackles, [place] oxygen Cardiac: Regular rate and rhythm, S1-S2, no  rubs murmurs or gallops, no peripheral edema, no JVD Abdomen: Soft nontender nondistended without obvious hepatosplenomegaly, no ascites Extremities: Symmetrical in appearance without cyanosis, clubbing or effusion  Scheduled Meds:  Scheduled Meds: . acetaminophen  650 mg Rectal  Q4H  . antiseptic oral rinse  7 mL Mouth Rinse q12n4p  . cefTRIAXone (ROCEPHIN)  IV  1 g Intravenous Q24H  . chlorhexidine  15 mL Mouth Rinse BID  . enoxaparin (LOVENOX) injection  40 mg Subcutaneous Q24H  . levothyroxine  50 mcg Intravenous QAC breakfast  . metoprolol  10 mg Intravenous 4 times per day   Data Reviewed: Basic Metabolic Panel:  Recent Labs Lab 06/30/14 1454 06/30/14 1547 07/01/14 0609 07/02/14 0353  NA 145 141 146 147  K 3.7 4.9 3.7 3.8  CL 109 111 111 114*  CO2 21  --  22 20  GLUCOSE 119* 114* 145* 104*  BUN 21 30* 19 20  CREATININE 0.71 0.70 0.59 0.64  CALCIUM 9.5  --  8.9 9.0   Liver Function Tests:  Recent Labs Lab 06/30/14 1454 07/01/14 0609  AST 18 15  ALT 19 16  ALKPHOS 67 71  BILITOT 0.4 0.3  PROT 6.9 7.0  ALBUMIN 3.0* 3.0*   CBC:  Recent Labs Lab 06/30/14 1454 06/30/14 1547 07/01/14 0609 07/02/14 0353  WBC 11.3*  --  18.2* 9.1  NEUTROABS 9.2*  --  13.3*  --   HGB 13.0 14.6 13.0 11.4*  HCT 37.2 43.0 39.8 34.0*  MCV 90.3  --  91.3 88.5  PLT 154  --  230 157   CBG:  Recent Labs Lab 06/30/14 1358 07/01/14 0638  GLUCAP 127* 126*    Recent Results (from the past 240 hour(s))  URINE CULTURE     Status: None   Collection Time    06/27/14  5:31 PM      Result Value Ref Range Status   Specimen Description URINE, CATHETERIZED   Final   Special Requests NONE   Final   Culture  Setup Time     Final   Value: 06/28/2014 13:21     Performed at [facility] Count     Final   Value: >=100,000 COLONIES/ML     Performed at [facility]   Culture     Final   Value: ESCHERICHIA COLI     Performed at [facility]   Report Status 07/01/2014 FINAL   Final   Organism ID, Bacteria ESCHERICHIA COLI   Final  URINE CULTURE     Status: None   Collection Time    06/30/14  1:52 PM      Result Value Ref Range Status   Specimen Description URINE, CATHETERIZED   Final   Special Requests ADD [id number]   Final    Culture  Setup Time     Final   Value: 06/30/2014 22:49     Performed at [facility] Count     Final   Value: >=100,000 COLONIES/ML     Performed at [facility]   Culture     Final   Value: ESCHERICHIA COLI     Performed at [facility]   Report Status 07/02/2014 FINAL   Final   Organism ID, Bacteria ESCHERICHIA COLI   Final  CULTURE, BLOOD (ROUTINE X 2)     Status: None   Collection Time    06/30/14  2:54 PM      Result  Value Ref Range Status   Specimen Description BLOOD LEFT HAND   Final   Special Requests BOTTLES DRAWN AEROBIC AND ANAEROBIC [facility]   Final   Culture  Setup Time     Final   Value: 06/30/2014 22:21     Performed at [facility]   Culture     Final   Value:        BLOOD CULTURE RECEIVED NO GROWTH TO DATE CULTURE WILL BE HELD FOR 5 DAYS BEFORE ISSUING A FINAL NEGATIVE REPORT     Performed at [facility]   Report Status PENDING   Incomplete  CULTURE, BLOOD (ROUTINE X 2)     Status: None   Collection Time    06/30/14  4:22 PM      Result Value Ref Range Status   Specimen Description BLOOD LEFT HAND   Final   Special Requests BOTTLES DRAWN AEROBIC AND ANAEROBIC 4 CC   Final   Culture  Setup Time     Final   Value: 06/30/2014 22:21     Performed at [facility]   Culture     Final   Value:        BLOOD CULTURE RECEIVED NO GROWTH TO DATE CULTURE WILL BE HELD FOR 5 DAYS BEFORE ISSUING A FINAL NEGATIVE REPORT     Performed at [facility]   Report Status PENDING   Incomplete  MRSA PCR SCREENING     Status: None   Collection Time    07/01/14  6:36 AM      Result Value Ref Range Status   MRSA by PCR NEGATIVE  NEGATIVE Final   Comment:            The GeneXpert MRSA Assay (FDA     approved for NASAL specimens     only), is one component of a     comprehensive MRSA colonization     surveillance program. It is not     intended to diagnose MRSA     infection nor to guide or     monitor treatment for      MRSA infections.     Studies:  Recent x-ray studies have been reviewed in detail by the Attending Physician  Time spent : 35 minutes  Junious Silk, ANP [facility] Office  [phone] Pager [phone]  **If unable to reach the above provider after paging please contact the Flow Manager @ [phone]  On-Call/Text Page:      [email or web address]      password [email or web address]  If 7PM-7AM, please contact night-coverage [email or web address] Password [email or web address] 07/02/2014, 1:59 PM   LOS: 2 days   Examined patient and discussed assessment and plan with ANP Revonda Standard and agree with the above plan. Discuss plan of care with daughter, answered all questions . Patient with multiple complex medical problems> 40 minutes spent in direct patient care

## 2014-07-02 NOTE — Evaluation (Signed)
Clinical/Bedside Swallow Evaluation Patient Details  Name: Erika Warren MRN: [id number] Date of Birth: 1945/12/14  Today's Date: 07/02/2014 Time: 1035-1050 SLP Time Calculation (min): 15 min  Past Medical History:  Past Medical History  Diagnosis Date  . Hypothyroid   . Allergic rhinitis   . Osteoporosis   . Menopause 2000  . Other and unspecified hyperlipidemia   . Tobacco use disorder   . Rectal polyp 04/2005  . Sigmoid diverticulosis   . Internal hemorrhoids   . Alzheimer's dementia     "late stage" (04/18/2014)  . Nonverbal   . Arthritis     "hands, legs, feet" (04/18/2014)  . Chronic lower back pain    Past Surgical History:  Past Surgical History  Procedure Laterality Date  . Appendectomy    . Tonsillectomy and adenoidectomy    . Cesarean section  1979  . Mouth surgery  1990's    "don't know why"   HPI:  68 year old female patient who is a resident of skilled nursing facility due to severe dementia. She has private sitters to assist in her care who have accompanied her to the hospital. Patient has experienced progressive mental status changes beginning Thursday 06/27/2014. At that time she was brought to emergency department where CT head was unremarkable so she was sent back to the nursing facility. She was returned once again to the emergency department on 06/30/2014 for altered mentation and UIT. At baseline the patient was nonverbal but normally alert and with assistance is able to ambulate. She apparently was on a regular diet without dysphagia precautions prior to admission. After admission nurses found the patient in acute distress noting she was tachycardic with agonal breathing and pallor. Her O2 sats were 70% on room air. She was placed on a nonrebreather mask; patient has been weaned to nasal cannula.     Assessment / Plan / Recommendation Clinical Impression  Patient presents with inability to complete bilabial seal to initiate swallow sequence resulting  in bolus spilling to posterior portion of mouth passively.  Patient either then demonstrates delayed or absent swallow response resulting in overt s/s of aspiration.  Recommend diligent oral care given that patient is a mouth breather and her oral mucosa is currently dry as well as continuation of NPO status at this time with SLP follow up and goals for PO readiness.      Aspiration Risk  Severe    Diet Recommendation NPO;Alternative means - temporary   Medication Administration: Via alternative means    Other  Recommendations Oral Care Recommendations: Oral care Q4 per protocol   Follow Up Recommendations  24 hour supervision/assistance;Skilled Nursing facility    Frequency and Duration min 3x week  2 weeks   Pertinent Vitals/Pain None    SLP Swallow Goals  See care plan    Swallow Study Prior Functional Status   Regular and thin     General HPI: 68 year old female patient who is a resident of skilled nursing facility due to severe dementia. She has private sitters to assist in her care who have accompanied her to the hospital. Patient has experienced progressive mental status changes beginning Thursday 06/27/2014. At that time she was brought to emergency department where CT head was unremarkable so she was sent back to the nursing facility. She was returned once again to the emergency department on 06/30/2014 for altered mentation and UIT. At baseline the patient was nonverbal but normally alert and with assistance is able to ambulate. She apparently was on a regular  diet without dysphagia precautions prior to admission. After admission nurses found the patient in acute distress noting she was tachycardic with agonal breathing and pallor. Her O2 sats were 70% on room air. She was placed on a nonrebreather mask; patient has been weaned to nasal cannula.   Type of Study: Bedside swallow evaluation Previous Swallow Assessment: none on recor Diet Prior to this Study: NPO Temperature  Spikes Noted: No Respiratory Status: Nasal cannula History of Recent Intubation: No Behavior/Cognition: Requires cueing;Doesn't follow directions Oral Cavity - Dentition: Adequate natural dentition Self-Feeding Abilities: Total assist Patient Positioning: Upright in chair Baseline Vocal Quality: Aphonic Volitional Cough: Cognitively unable to elicit Volitional Swallow: Unable to elicit    Oral/Motor/Sensory Function Overall Oral Motor/Sensory Function: Other (comment) (unable to assess but appeared impaired)   Ice Chips Ice chips: Impaired Presentation: Spoon Oral Phase Impairments: Reduced labial seal;Reduced lingual movement/coordination;Impaired anterior to posterior transit;Poor awareness of bolus Pharyngeal Phase Impairments: Suspected delayed Swallow;Cough - Delayed   Thin Liquid Thin Liquid: Impaired Presentation: Spoon Oral Phase Impairments: Reduced labial seal;Reduced lingual movement/coordination;Impaired anterior to posterior transit;Poor awareness of bolus Pharyngeal  Phase Impairments: Multiple swallows;Suspected delayed Swallow;Unable to trigger swallow;Cough - Delayed    Nectar Thick Nectar Thick Liquid: Not tested   Honey Thick Honey Thick Liquid: Not tested   Puree Puree: Not tested   Solid   GO    Solid: Not tested      Erika Warren, M.A., CCC-SLP [phone]  Erika Warren 07/02/2014,11:31 AM

## 2014-07-03 ENCOUNTER — Inpatient Hospital Stay (HOSPITAL_COMMUNITY): Payer: Medicare Other

## 2014-07-03 DIAGNOSIS — E870 Hyperosmolality and hypernatremia: Secondary | ICD-10-CM

## 2014-07-03 LAB — CBC WITH DIFFERENTIAL/PLATELET
Basophils Absolute: 0 10*3/uL (ref 0.0–0.1)
Basophils Relative: 0 % (ref 0–1)
EOS ABS: 0.2 10*3/uL (ref 0.0–0.7)
EOS PCT: 3 % (ref 0–5)
HEMATOCRIT: 36 % (ref 36.0–46.0)
HEMOGLOBIN: 11.9 g/dL — AB (ref 12.0–15.0)
LYMPHS ABS: 1.4 10*3/uL (ref 0.7–4.0)
Lymphocytes Relative: 15 % (ref 12–46)
MCH: 29.8 pg (ref 26.0–34.0)
MCHC: 33.1 g/dL (ref 30.0–36.0)
MCV: 90 fL (ref 78.0–100.0)
Monocytes Absolute: 0.5 10*3/uL (ref 0.1–1.0)
Monocytes Relative: 5 % (ref 3–12)
Neutro Abs: 7.3 10*3/uL (ref 1.7–7.7)
Neutrophils Relative %: 77 % (ref 43–77)
Platelets: 173 10*3/uL (ref 150–400)
RBC: 4 MIL/uL (ref 3.87–5.11)
RDW: 12.9 % (ref 11.5–15.5)
WBC: 9.3 10*3/uL (ref 4.0–10.5)

## 2014-07-03 LAB — BASIC METABOLIC PANEL
ANION GAP: 18 — AB (ref 5–15)
ANION GAP: 18 — AB (ref 5–15)
BUN: 15 mg/dL (ref 6–23)
BUN: 14 mg/dL (ref 6–23)
CALCIUM: 8.8 mg/dL (ref 8.4–10.5)
CALCIUM: 9.1 mg/dL (ref 8.4–10.5)
CHLORIDE: 114 meq/L — AB (ref 96–112)
CHLORIDE: 110 meq/L (ref 96–112)
CO2: 19 meq/L (ref 19–32)
CO2: 17 meq/L — AB (ref 19–32)
CREATININE: 0.56 mg/dL (ref 0.50–1.10)
Creatinine, Ser: 0.6 mg/dL (ref 0.50–1.10)
GFR calc Af Amer: >90 mL/min (ref >90)
GFR calc Af Amer: >90 mL/min (ref >90)
GFR calc non Af Amer: >90 mL/min (ref >90)
GFR calc non Af Amer: >90 mL/min (ref >90)
GLUCOSE: 79 mg/dL (ref 70–99)
GLUCOSE: 95 mg/dL (ref 70–99)
Potassium: 3.4 mEq/L — ABNORMAL LOW (ref 3.7–5.3)
Potassium: 3.6 mEq/L — ABNORMAL LOW (ref 3.7–5.3)
Sodium: 151 mEq/L — ABNORMAL HIGH (ref 137–147)
Sodium: 145 mEq/L (ref 137–147)

## 2014-07-03 LAB — BLOOD GAS, ARTERIAL
Acid-base deficit: 4.4 mmol/L — ABNORMAL HIGH (ref 0.0–2.0)
BICARBONATE: 19.9 meq/L — AB (ref 20.0–24.0)
Drawn by: 41977
O2 CONTENT: 4 L/min
O2 Saturation: 95.9 %
PCO2 ART: 35.2 mmHg (ref 35.0–45.0)
PH ART: 7.372 (ref 7.350–7.450)
Patient temperature: 98.6
TCO2: 21 mmol/L (ref 0–100)
pO2, Arterial: 83.1 mmHg (ref 80.0–100.0)

## 2014-07-03 MED ORDER — ALBUTEROL SULFATE (2.5 MG/3ML) 0.083% IN NEBU
2.5000 mg | INHALATION_SOLUTION | RESPIRATORY_TRACT | Status: DC
  Administered 2014-07-03 – 2014-07-04 (×4): 2.5 mg via RESPIRATORY_TRACT
  Filled 2014-07-03 (×4): qty 3

## 2014-07-03 MED ORDER — POTASSIUM CHLORIDE 10 MEQ/100ML IV SOLN
10.0000 meq | INTRAVENOUS | Status: AC
  Administered 2014-07-03 (×3): 10 meq via INTRAVENOUS
  Filled 2014-07-03: qty 100

## 2014-07-03 MED ORDER — DEXTROSE 5 % IV SOLN
500.0000 mg | INTRAVENOUS | Status: DC
  Administered 2014-07-03: 500 mg via INTRAVENOUS
  Filled 2014-07-03 (×2): qty 500

## 2014-07-03 MED ORDER — SODIUM CHLORIDE 0.45 % IV SOLN
INTRAVENOUS | Status: DC
  Administered 2014-07-03 – 2014-07-04 (×2): via INTRAVENOUS

## 2014-07-03 MED ORDER — MORPHINE SULFATE 2 MG/ML IJ SOLN: 1.0000 mg | INTRAMUSCULAR | Status: DC | PRN

## 2014-07-03 MED ORDER — VANCOMYCIN HCL IN DEXTROSE 750-5 MG/150ML-% IV SOLN
750.0000 mg | Freq: Two times a day (BID) | INTRAVENOUS | Status: DC
  Administered 2014-07-03 – 2014-07-06 (×6): 750 mg via INTRAVENOUS
  Filled 2014-07-03 (×7): qty 150

## 2014-07-03 MED ORDER — DEXTROSE 5 % IV SOLN
1.0000 g | Freq: Three times a day (TID) | INTRAVENOUS | Status: DC
  Administered 2014-07-03 – 2014-07-04 (×2): 1 g via INTRAVENOUS
  Filled 2014-07-03 (×4): qty 1

## 2014-07-03 NOTE — Progress Notes (Addendum)
Patient to transfer to 2600 X ray with worsening infiltrates, possible aspiration will broaden abx and will need to discuss palliative care with family nebs

## 2014-07-03 NOTE — Progress Notes (Signed)
Progress Note  Erika Warren [facility] AVW:[id number] DOB: [birth year] DOA: 06/30/2014 PCP: Pcp Not In System  Brief narrative: 68 year old female patient who is a resident of skilled nursing facility due to severe dementia. She has private sitters to assist in her care who have accompanied her to the hospital. Patient has experienced progressive mental status changes beginning Thursday 8/20. At that time she was brought to the emergency department where CT head was unremarkable so she was sent back to the nursing facility. She was returned once again to the emergency department on 06/30/2014 for altered mentation.  In the ER she was found to have an abnormal urinalysis and mild leukocytosis consistent with likely urinary tract infection. At baseline the patient was nonverbal but normally alert and with assistance is able to ambulate. She apparently was on a regular diet without dysphagia precautions prior to admission.  After admission nurses found the patient in acute distress noting she was tachycardic with agonal breathing and pallor. Her O2 sats were 70% on room air. She was placed on a nonrebreather mask. On-call M.D. evaluated the patient at the bedside and transfered the patient to the step down ICU. She did require one time nasotracheal suctioning at which time a mucoid plug was returned. She has remained somnolent despite these measures. ABG revealed respiratory acidosis with a PCO2 of 75. Her family was updated at that time.  HPI/Subjective: Snoring respirations Will briefly open eyes when you say her name  Assessment/Plan:   Acute toxic metabolic and hypercapnic encephalopathy -Multifactorial: Initially from infection and then from hypercarbia and now possibly hypernatremia    Acute respiratory failure with hypoxia and hypercarbia -ABG shows improvement, continue to wean o2 to keep sats 90-92%    Recurrent E Coli UTI  -Continue Rocephin-resistant to Septra-followup on blood cx's  -recent pansensitive Escherichia coli urinary tract infection 06/27/2014, 04/18/2014 and 01/02/2014    Volume depletion -change to 1/2 NS    Hypothyroid -Continue Synthroid - TSH 0.100 with normal T4 and low T3 consistent with sick euthyroid syndrome    Dementia -too somnolent to continue home medication-SLP eval rec NPO for now-per son at baseline she eats regualr diet with thin liquids and uses a straw-also chews all meds   OA -on scheduled Tylenol 500 mg PO QID at SNF- pt becomes very sleepy with narcs and Celebrex so son has asked to avoid-does ok with Ibuprofen   DVT prophylaxis: Lovenox Code Status: DO NOT RESUSCITATE Family Communication: no family at bedside- will call son Disposition Plan/Expected LOS: Stepdown   Consultants: None  Procedures: None  Cultures: 8/23 blood cultures x 2 pending 8/23 urine culture E. Coli resistant to Septra  Antibiotics: Rocephin 8/23 >>  Objective: Blood pressure 129/72, pulse 95, temperature 98.7 F (37.1 C), temperature source Oral, resp. rate 14, height  (1.651 m), weight 56.6 kg (124 lb 12.5 oz), SpO2 97.00%.  Intake/Output Summary (Last 24 hours) at 07/03/14 0802 Last data filed at 07/03/14 0400  Gross per 24 hour  Intake   1550 ml  Output      0 ml  Net   1550 ml   Exam: Gen: will flutter open eyes to voice Chest: Clear to auscultation bilaterally without wheezes, rhonchi or crackles, Wheeler oxygen Cardiac: Regular rate and rhythm, S1-S2, no rubs murmurs or gallops, no peripheral edema, no JVD Abdomen: Soft nontender nondistended without obvious hepatosplenomegaly, no ascites Extremities: Symmetrical in appearance without cyanosis, clubbing or effusion  Scheduled Meds:  Scheduled Meds: .  acetaminophen  650 mg Rectal Q4H  . antiseptic oral rinse  7 mL Mouth Rinse q12n4p  . cefTRIAXone (ROCEPHIN)  IV  1 g Intravenous Q24H  . chlorhexidine  15 mL Mouth Rinse BID  . enoxaparin (LOVENOX) injection  40 mg Subcutaneous  Q24H  . levothyroxine  50 mcg Intravenous QAC breakfast  . metoprolol  10 mg Intravenous 4 times per day   Data Reviewed: Basic Metabolic Panel:  Recent Labs Lab 06/30/14 1454 06/30/14 1547 07/01/14 0609 07/02/14 0353 07/02/14 2152  NA 145 141 146 147 150*  K 3.7 4.9 3.7 3.8 3.5*  CL 109 111 111 114* 116*  CO2 21  --  GLUCOSE 119* 114* 145* 104* 91  BUN 21 30* CREATININE 0.71 0.70 0.59 0.64 0.60  CALCIUM 9.5  --  8.9 9.0 9.3   Liver Function Tests:  Recent Labs Lab 06/30/14 1454 07/01/14 0609 07/02/14 2152  AST ALT ALKPHOS 67 71 65  BILITOT 0.4 0.3 0.3  PROT 6.9 7.0 6.3  ALBUMIN 3.0* 3.0* 2.4*   CBC:  Recent Labs Lab 06/30/14 1454 06/30/14 1547 07/01/14 0609 07/02/14 0353 07/03/14 0245  WBC 11.3*  --  18.2* 9.1 9.3  NEUTROABS 9.2*  --  13.3*  --  7.3  HGB 13.0 14.6 13.0 11.4* 11.9*  HCT 37.2 43.0 39.8 34.0* 36.0  MCV 90.3  --  91.3 88.5 90.0  PLT 154  --  230 157 173   CBG:  Recent Labs Lab 06/30/14 1358 07/01/14 0638 07/02/14 1952  GLUCAP 127* 126* 86    Recent Results (from the past 240 hour(s))  URINE CULTURE     Status: None   Collection Time    06/27/14  5:31 PM      Result Value Ref Range Status   Specimen Description URINE, CATHETERIZED   Final   Special Requests NONE   Final   Culture  Setup Time     Final   Value: 06/28/2014 13:21     Performed at [facility] Count     Final   Value: >=100,000 COLONIES/ML     Performed at [facility]   Culture     Final   Value: ESCHERICHIA COLI     Performed at [facility]   Report Status 07/01/2014 FINAL   Final   Organism ID, Bacteria ESCHERICHIA COLI   Final  URINE CULTURE     Status: None   Collection Time    06/30/14  1:52 PM      Result Value Ref Range Status   Specimen Description URINE, CATHETERIZED   Final   Special Requests ADD [id number]   Final   Culture  Setup Time     Final   Value: 06/30/2014  22:49     Performed at [facility] Count     Final   Value: >=100,000 COLONIES/ML     Performed at [facility]   Culture     Final   Value: ESCHERICHIA COLI     Performed at [facility]   Report Status 07/02/2014 FINAL   Final   Organism ID, Bacteria ESCHERICHIA COLI   Final  CULTURE, BLOOD (ROUTINE X 2)     Status: None   Collection Time    06/30/14  2:54 PM      Result Value Ref Range Status  Specimen Description BLOOD LEFT HAND   Final   Special Requests BOTTLES DRAWN AEROBIC AND ANAEROBIC [facility]   Final   Culture  Setup Time     Final   Value: 06/30/2014 22:21     Performed at [facility]   Culture     Final   Value:        BLOOD CULTURE RECEIVED NO GROWTH TO DATE CULTURE WILL BE HELD FOR 5 DAYS BEFORE ISSUING A FINAL NEGATIVE REPORT     Performed at [facility]   Report Status PENDING   Incomplete  CULTURE, BLOOD (ROUTINE X 2)     Status: None   Collection Time    06/30/14  4:22 PM      Result Value Ref Range Status   Specimen Description BLOOD LEFT HAND   Final   Special Requests BOTTLES DRAWN AEROBIC AND ANAEROBIC 4 CC   Final   Culture  Setup Time     Final   Value: 06/30/2014 22:21     Performed at [facility]   Culture     Final   Value:        BLOOD CULTURE RECEIVED NO GROWTH TO DATE CULTURE WILL BE HELD FOR 5 DAYS BEFORE ISSUING A FINAL NEGATIVE REPORT     Performed at [facility]   Report Status PENDING   Incomplete  MRSA PCR SCREENING     Status: None   Collection Time    07/01/14  6:36 AM      Result Value Ref Range Status   MRSA by PCR NEGATIVE  NEGATIVE Final   Comment:            The GeneXpert MRSA Assay (FDA     approved for NASAL specimens     only), is one component of a     comprehensive MRSA colonization     surveillance program. It is not     intended to diagnose MRSA     infection nor to guide or     monitor treatment for     MRSA infections.       Marlin Canary [facility]  Pager [phone]  **If unable to reach the above provider after paging please contact the Flow Manager @ [phone]  On-Call/Text Page:      [email or web address]      password [email or web address]  If 7PM-7AM, please contact night-coverage [email or web address] Password [email or web address] 07/03/2014, 8:02 AM  35 min   LOS: 3 days

## 2014-07-03 NOTE — Progress Notes (Signed)
SLP Cancellation Note  Patient Details Name: Erika Warren MRN: [id number] DOB: 23-Sep-1946   Cancelled treatment:       Reason Eval/Treat Not Completed: Fatigue/lethargy limiting ability to participate.  Pt not sufficiently alert.  Will f/u later today per family's request.     Blenda Mounts [place]ice 07/03/2014, 11:36 AM

## 2014-07-03 NOTE — Care Management Note (Signed)
    Page 1 of 1   07/03/2014     11:06:30 AM CARE MANAGEMENT NOTE 07/03/2014  Patient:  Erika Warren, Erika Warren   Account Number:  [id number]  Date Initiated:  07/03/2014  Documentation initiated by:  Junius Creamer  Subjective/Objective Assessment:   adm w Manus Gunning at [facility]     Action/Plan:   Anticipated DC Date:     Anticipated DC Plan:    In-house referral  Clinical Social Worker         Choice offered to / List presented to:             Status of service:   Medicare Important Message given?  YES (If response is "NO", the following Medicare IM given date fields will be blank) Date Medicare IM given:  07/03/2014 Medicare IM given by:  Junius Creamer Date Additional Medicare IM given:   Additional Medicare IM given by:    Discharge Disposition:    Per UR Regulation:  Reviewed for med. necessity/level of care/duration of stay  If discussed at Long Length of Stay Meetings, dates discussed:    Comments:

## 2014-07-03 NOTE — Progress Notes (Signed)
ANTIBIOTIC CONSULT NOTE - INITIAL  Pharmacy Consult for Vancomycin Indication: pneumonia  Allergies  Allergen Reactions  . Lorazepam Other (See Comments)    Unknown   . Sulfa Antibiotics     Unknown     Patient Measurements: Height: 5\' 5"  (165.1 cm) Weight: 127 lb 6.8 oz (57.8 kg) IBW/kg (Calculated) : 57   Vital Signs: Temp: 98.3 F (36.8 C) (08/26 1600) Temp src: Axillary (08/26 1600) BP: 115/52 mmHg (08/26 1500) Pulse Rate: 100 (08/26 1500) Intake/Output from previous day: 08/25 0701 - 08/26 0700 In: 1625 [I.V.:1575; IV Piggyback:50] Out: -  Intake/Output from this shift:    Labs:  Recent Labs  07/01/14 0609 07/02/14 0353 07/02/14 2152 07/03/14 0245 07/03/14 0842  WBC 18.2* 9.1  --  9.3  --   HGB 13.0 11.4*  --  11.9*  --   PLT 230 157  --  173  --   CREATININE 0.59 0.64 0.60  --  0.56   Estimated Creatinine Clearance: 60.6 ml/min (by C-G formula based on Cr of 0.56). No results found for this basename: VANCOTROUGH, VANCOPEAK, VANCORANDOM, GENTTROUGH, GENTPEAK, GENTRANDOM, TOBRATROUGH, TOBRAPEAK, TOBRARND, AMIKACINPEAK, AMIKACINTROU, AMIKACIN,  in the last 72 hours   Microbiology: Recent Results (from the past 720 hour(s))  URINE CULTURE     Status: None   Collection Time    06/27/14  5:31 PM      Result Value Ref Range Status   Specimen Description URINE, CATHETERIZED   Final   Special Requests NONE   Final   Culture  Setup Time     Final   Value: 06/28/2014 13:21     Performed at [facility] Count     Final   Value: >=100,000 COLONIES/ML     Performed at [facility]   Culture     Final   Value: ESCHERICHIA COLI     Performed at [facility]   Report Status 07/01/2014 FINAL   Final   Organism ID, Bacteria ESCHERICHIA COLI   Final  URINE CULTURE     Status: None   Collection Time    06/30/14  1:52 PM      Result Value Ref Range Status   Specimen Description URINE, CATHETERIZED   Final   Special Requests ADD  [id number]   Final   Culture  Setup Time     Final   Value: 06/30/2014 22:49     Performed at [facility] Count     Final   Value: >=100,000 COLONIES/ML     Performed at [facility]   Culture     Final   Value: ESCHERICHIA COLI     Performed at [facility]   Report Status 07/02/2014 FINAL   Final   Organism ID, Bacteria ESCHERICHIA COLI   Final  CULTURE, BLOOD (ROUTINE X 2)     Status: None   Collection Time    06/30/14  2:54 PM      Result Value Ref Range Status   Specimen Description BLOOD LEFT HAND   Final   Special Requests BOTTLES DRAWN AEROBIC AND ANAEROBIC [facility]   Final   Culture  Setup Time     Final   Value: 06/30/2014 22:21     Performed at [facility]   Culture     Final   Value:        BLOOD CULTURE RECEIVED NO GROWTH TO DATE CULTURE WILL BE HELD  FOR 5 DAYS BEFORE ISSUING A FINAL NEGATIVE REPORT     Performed at [facility]   Report Status PENDING   Incomplete  CULTURE, BLOOD (ROUTINE X 2)     Status: None   Collection Time    06/30/14  4:22 PM      Result Value Ref Range Status   Specimen Description BLOOD LEFT HAND   Final   Special Requests BOTTLES DRAWN AEROBIC AND ANAEROBIC 4 CC   Final   Culture  Setup Time     Final   Value: 06/30/2014 22:21     Performed at [facility]   Culture     Final   Value:        BLOOD CULTURE RECEIVED NO GROWTH TO DATE CULTURE WILL BE HELD FOR 5 DAYS BEFORE ISSUING A FINAL NEGATIVE REPORT     Performed at [facility]   Report Status PENDING   Incomplete  MRSA PCR SCREENING     Status: None   Collection Time    07/01/14  6:36 AM      Result Value Ref Range Status   MRSA by PCR NEGATIVE  NEGATIVE Final   Comment:            The GeneXpert MRSA Assay (FDA     approved for NASAL specimens     only), is one component of a     comprehensive MRSA colonization     surveillance program. It is not     intended to diagnose MRSA     infection nor to guide or      monitor treatment for     MRSA infections.    Medical History: Past Medical History  Diagnosis Date  . Hypothyroid   . Allergic rhinitis   . Osteoporosis   . Menopause 2000  . Other and unspecified hyperlipidemia   . Tobacco use disorder   . Rectal polyp 04/2005  . Sigmoid diverticulosis   . Internal hemorrhoids   . Alzheimer's dementia     "late stage" (04/18/2014)  . Nonverbal   . Arthritis     "hands, legs, feet" (04/18/2014)  . Chronic lower back pain     Medications:  Prescriptions prior to admission  Medication Sig Dispense Refill  . acetaminophen (TYLENOL) 160 MG/5ML suspension Take 500 mg by mouth 3 (three) times daily.      Marland Kitchen aspirin 81 MG chewable tablet Chew 81 mg by mouth daily.      . Cholecalciferol (VITAMIN D3) 1000 UNITS CHEW Chew 1 tablet by mouth 3 (three) times daily.      . Cranberry Fruit 475 MG CAPS Take 475 mg by mouth 2 (two) times daily. May open and mix with food      . HYDROcodone-acetaminophen (NORCO/VICODIN) 5-325 MG per tablet Take 1 tablet by mouth every 6 (six) hours as needed for moderate pain.  10 tablet  0  . ibuprofen (ADVIL,MOTRIN) 100 MG/5ML suspension Take 400 mg by mouth every 6 (six) hours as needed for mild pain.      Marland Kitchen levothyroxine (SYNTHROID, LEVOTHROID) 100 MCG tablet Take 1 tablet (100 mcg total) by mouth daily before breakfast.  30 tablet  0  . Melatonin 5 MG TABS Take 5 mg by mouth at bedtime as needed (sleep).      . memantine (NAMENDA) 10 MG tablet Take 10 mg by mouth 2 (two) times daily.      . Multiple Vitamins-Minerals (MULTIVITAMIN GUMMIES ADULT PO) Take 1  each by mouth daily.      . rosuvastatin (CRESTOR) 20 MG tablet Take 20 mg by mouth daily.       Assessment: 68 YOF on cefepime and azithromycin for pneumonia. Pharmacy consulted to broaden antibiotic coverage to Vancomycin for worsening chest infiltrates on chest x ray. WBC wnl. Pt is afebrile. CrCl stable ~ 60 mL/min   Cultures: 8/23 Urine Cx >> E.coli  8/23 Blood Cx  x2>>   Goal of Therapy:  Vancomycin trough level 15-20 mcg/ml  Plan:  -Start Vancomycin 750 mg IV Q 12 hours x 8 days  -Continue Cefepime 1 gm IV Q 8 hours and azithromycin 500 mg IV Q 24 hours  -If concerned for aspiration pneumonia, recommend switching Cefepime to Zosyn for anaerobic coverage  -Monitor CBC, renal fx, cultures and patient's clinical progress  -Vanc level at [facility], PharmD.  Clinical Pharmacist Pager [phone]

## 2014-07-04 DIAGNOSIS — R000 Tachycardia, unspecified: Secondary | ICD-10-CM

## 2014-07-04 DIAGNOSIS — J690 Pneumonitis due to inhalation of food and vomit: Principal | ICD-10-CM

## 2014-07-04 LAB — BASIC METABOLIC PANEL
Anion gap: 14 (ref 5–15)
BUN: 14 mg/dL (ref 6–23)
CO2: 20 mEq/L (ref 19–32)
CREATININE: 0.6 mg/dL (ref 0.50–1.10)
Calcium: 8.7 mg/dL (ref 8.4–10.5)
Chloride: 110 mEq/L (ref 96–112)
GFR calc non Af Amer: >90 mL/min (ref >90)
GLUCOSE: 82 mg/dL (ref 70–99)
POTASSIUM: 3.4 meq/L — AB (ref 3.7–5.3)
Sodium: 144 mEq/L (ref 137–147)

## 2014-07-04 LAB — MAGNESIUM: Magnesium: 1.9 mg/dL (ref 1.5–2.5)

## 2014-07-04 LAB — CBC
HEMATOCRIT: 35.8 % — AB (ref 36.0–46.0)
HEMOGLOBIN: 11.7 g/dL — AB (ref 12.0–15.0)
MCH: 29.3 pg (ref 26.0–34.0)
MCHC: 32.7 g/dL (ref 30.0–36.0)
MCV: 89.7 fL (ref 78.0–100.0)
Platelets: 177 10*3/uL (ref 150–400)
RBC: 3.99 MIL/uL (ref 3.87–5.11)
RDW: 12.8 % (ref 11.5–15.5)
WBC: 7.1 10*3/uL (ref 4.0–10.5)

## 2014-07-04 MED ORDER — ALBUTEROL SULFATE (2.5 MG/3ML) 0.083% IN NEBU: 2.5000 mg | INHALATION_SOLUTION | RESPIRATORY_TRACT | Status: DC | PRN

## 2014-07-04 MED ORDER — METOPROLOL TARTRATE 1 MG/ML IV SOLN
5.0000 mg | Freq: Once | INTRAVENOUS | Status: AC
  Administered 2014-07-04: 5 mg via INTRAVENOUS

## 2014-07-04 MED ORDER — METOPROLOL TARTRATE 1 MG/ML IV SOLN: 2.5000 mg | Freq: Four times a day (QID) | INTRAVENOUS | Status: DC | PRN

## 2014-07-04 MED ORDER — ACETAMINOPHEN 325 MG RE SUPP
325.0000 mg | Freq: Once | RECTAL | Status: AC
  Administered 2014-07-04: 325 mg via RECTAL
  Filled 2014-07-04: qty 1

## 2014-07-04 MED ORDER — ALBUTEROL SULFATE (2.5 MG/3ML) 0.083% IN NEBU
2.5000 mg | INHALATION_SOLUTION | Freq: Four times a day (QID) | RESPIRATORY_TRACT | Status: DC
  Administered 2014-07-04 – 2014-07-07 (×12): 2.5 mg via RESPIRATORY_TRACT
  Filled 2014-07-04 (×12): qty 3

## 2014-07-04 MED ORDER — SODIUM CHLORIDE 0.45 % IV SOLN
INTRAVENOUS | Status: AC
  Administered 2014-07-04: 20:00:00 via INTRAVENOUS

## 2014-07-04 MED ORDER — POTASSIUM CHLORIDE 10 MEQ/100ML IV SOLN
10.0000 meq | INTRAVENOUS | Status: AC
  Administered 2014-07-04 (×3): 10 meq via INTRAVENOUS
  Filled 2014-07-04 (×3): qty 100

## 2014-07-04 MED ORDER — PIPERACILLIN-TAZOBACTAM 3.375 G IVPB
3.3750 g | Freq: Three times a day (TID) | INTRAVENOUS | Status: DC
  Administered 2014-07-04 – 2014-07-07 (×10): 3.375 g via INTRAVENOUS
  Filled 2014-07-04 (×12): qty 50

## 2014-07-04 MED ORDER — KETOROLAC TROMETHAMINE 15 MG/ML IJ SOLN
15.0000 mg | Freq: Two times a day (BID) | INTRAMUSCULAR | Status: AC | PRN
  Administered 2014-07-06: 15 mg via INTRAVENOUS
  Filled 2014-07-04: qty 1

## 2014-07-04 NOTE — Progress Notes (Signed)
Pt taken off bipap and placed on 4L [place], tolerating well.

## 2014-07-04 NOTE — Progress Notes (Signed)
Telephone call made to MD. To inform of medication warning of exceeding 4g of Tylenol and Blood pressure of 116/68 and highest recorded of shift 123/70. HR unremarkable. Will continue to monitor patient status. Melany Guernsey

## 2014-07-04 NOTE — Progress Notes (Signed)
  Echocardiogram 2D Echocardiogram has been performed.  Erika Warren 07/04/2014, 1:24 PM

## 2014-07-04 NOTE — Progress Notes (Signed)
Duo neb not given to pt at 1212, only albuterol

## 2014-07-04 NOTE — Progress Notes (Signed)
Telephone call from Dr. Craige Cotta. Informed of medication warning of exceeding 4g of Tylenol and Blood pressure of 116/68 and highest recorded of shift 123/70. HR unremarkable.  Orders given.   Will continue to monitor patient status. Melany Guernsey

## 2014-07-04 NOTE — Progress Notes (Addendum)
Progress Note  Erika Warren [facility] IRJ:[id number] DOB: 03-10-1946 DOA: 06/30/2014 PCP: Pcp Not In System  Brief narrative: 68 year old female patient who is a resident of skilled nursing facility due to severe dementia. She has private sitters to assist in her care who have accompanied her to the hospital. Patient has experienced progressive mental status changes beginning Thursday 8/20. At that time she was brought to the emergency department where CT head was unremarkable so she was sent back to the nursing facility. She was returned once again to the emergency department on 06/30/2014 for altered mentation.  In the ER she was found to have an abnormal urinalysis and mild leukocytosis consistent with likely urinary tract infection. At baseline the patient was nonverbal but normally alert and with assistance is able to ambulate. She apparently was on a regular diet without dysphagia precautions prior to admission.  After admission nurses found the patient in acute distress noting she was tachycardic with agonal breathing and pallor. Her O2 sats were 70% on room air. She was placed on a nonrebreather mask. On-call M.D. evaluated the patient at the bedside and transfered the patient to the step down ICU. She did require one time nasotracheal suctioning at which time a mucoid plug was returned. She has remained somnolent despite these measures. ABG revealed respiratory acidosis with a PCO2 of 75. Her family was updated at that time.  HPI/Subjective: Patient nonverbal but waking up.  Assessment/Plan:   Acute toxic metabolic and hypercapnic encephalopathy -Multifactorial: Initially from infection and then from hypercarbia and now possibly hypernatremia - CT head negative for acute findings. - Hypercapnia and hypernatremia have improved/resolved. - Mental status seems to be improving. DC BiPAP and monitor.    Acute respiratory failure with hypoxia and hypercarbia -Possibly from aspiration  pneumonia versus HCAP. No evidence of peripheral edema to suggest CHF. Improving. Try to DC BiPAP and titrate down oxygen as tolerated.  Aspiration pneumonia versus HCAP - Will DC cefepime and azithromycin. Continue vancomycin. Add IV Zosyn per pharmacy. Pulmonary toilet. Speech therapy consulted for swallowing evaluation. - Will need followup chest x-rays and 4-6 weeks.    Recurrent E Coli UTI  - Initially placed on Rocephin but currently on cefepime for pneumonia which should cover.    Dehydration with hypernatremia - Treated with hypotonic IV fluids. Improving. Continue for additional 24 hours.    Hypothyroid -Continue Synthroid - TSH 0.100 with normal T4 and low T3 consistent with sick euthyroid syndrome. Recommend repeating TSH in 4-6 weeks. No change in Synthroid dose at this time. Some of these abnormal labs could be secondary to acute illness. As per discussion with PCP, has been on current dose of Synthroid for a while. Continue IV Synthroid at half the dose until able to take by mouth.    Advanced Alzheimer's Dementia - As per son, patient has advanced dementia and mostly nonverbal. Current mental status changes secondary to reasons above.   OA -on scheduled Tylenol 500 mg PO QID at SNF- pt becomes very sleepy with narcs and Celebrex so son has asked to avoid-does ok with Ibuprofen. Discontinued morphine when necessary on 8/27-has not received a dose since hospitalization. Added when necessary IV Toradol for pain not controlled with acetaminophen.  PSVT versus NSVT - patient had tachycardia in the 160s on 8/24, likely secondary to acute respiratory failure and acute illness. She had been placed on IV metoprolol scheduled. Currently reverted to sinus rhythm but had an episode of nonsustained wide complex tachycardia on 8/26  at 5:05 PM.? PSVT versus NSVT. Also soft blood pressures. Change IV metoprolol to when necessary. TSH low. Replace potassium. Magnesium 1.9. Check 2-D echo. As  discussed with son/health care power of attorney, treat as above and hold off on cardiology consultation at this time.   Hypokalemia - Replace and follow. Magnesium normal  History of fall at SNF - Return to SNF when medically stable  Anemia - Stable   DVT prophylaxis: Patient's son wishes to DC Lovenox and use SCDs. Code Status: DO NOT RESUSCITATE Family Communication:  discussed with patient's son/HCPOA Mr. Smart at bedside. Spent 30 minutes updating care and answering all questions. At this time wishes to pursue aggressive care. Also discussed with patient's PCP Dr. Herschel Senegal who is in agreement with current treatment plan and wishes to be updated on daily basis so she can assist in discussing with family and helping care.  Disposition Plan/Expected LOS:  keep in step down unit for additional 24 hours.    Consultants: None  Procedures:  BiPAP   Cultures: 8/23 blood cultures x 2 pending 8/23 urine culture E. Coli resistant to Septra  Antibiotics: Rocephin 8/23 >> 8/26   IV vancomycin 8/26 > IV cefepime 8/26 > 8/27 IV azithromycin 8/26 > 8/27 IV Zosyn 8/27 >.  Objective: Blood pressure 108/64, pulse 92, temperature 97.3 F (36.3 C), temperature source Axillary, resp. rate 14, height  (1.651 m), weight 57.8 kg (127 lb 6.8 oz), SpO2 99.00%.  Intake/Output Summary (Last 24 hours) at 07/04/14 1122 Last data filed at 07/04/14 1017  Gross per 24 hour  Intake    900 ml  Output      0 ml  Net    900 ml   Exam: Gen:  elderly frail female, moderately built and nourished, lying comfortably propped up in bed with BiPAP in place this morning. Chest:  diminished breath sounds bilaterally with scattered bilateral basal crackles. No rhonchi. No increased work of breathing.  Cardiac: Regular rate and rhythm, S1-S2, no rubs murmurs or gallops, no peripheral edema, no JVD. Telemetry: Sinus rhythm. Patient had episode of nonsustained wide complex tachycardia on 8/26 at 1705  hours  Abdomen: Soft nontender nondistended without obvious hepatosplenomegaly, no ascites Extremities: Symmetrical in appearance without cyanosis, clubbing or effusion  CNS: Somnolent but will open eyes, nonverbal and will not follow commands.  Scheduled Meds:  Scheduled Meds: . acetaminophen  650 mg Rectal Q4H  . albuterol  2.5 mg Nebulization QID  . antiseptic oral rinse  7 mL Mouth Rinse q12n4p  . azithromycin  500 mg Intravenous Q24H  . ceFEPime (MAXIPIME) IV  1 g Intravenous 3 times per day  . chlorhexidine  15 mL Mouth Rinse BID  . enoxaparin (LOVENOX) injection  40 mg Subcutaneous Q24H  . levothyroxine  50 mcg Intravenous QAC breakfast  . potassium chloride  10 mEq Intravenous Q1 Hr x 3  . vancomycin  750 mg Intravenous Q12H   Data Reviewed: Basic Metabolic Panel:  Recent Labs Lab 07/02/14 0353 07/02/14 2152 07/03/14 0842 07/03/14 2049 07/04/14 0424  NA 147 150* 151* 145 144  K 3.8 3.5* 3.4* 3.6* 3.4*  CL 114* 116* 114* 110 110  CO2 17* 20  GLUCOSE 104* 91 79 95 82  BUN CREATININE 0.64 0.60 0.56 0.60 0.60  CALCIUM 9.0 9.3 8.8 9.1 8.7  MG  --   --   --   --  1.9   Liver Function Tests:  Recent Labs Lab 06/30/14 1454 07/01/14 0609 07/02/14 2152  AST ALT ALKPHOS 67 71 65  BILITOT 0.4 0.3 0.3  PROT 6.9 7.0 6.3  ALBUMIN 3.0* 3.0* 2.4*   CBC:  Recent Labs Lab 06/30/14 1454 06/30/14 1547 07/01/14 0609 07/02/14 0353 07/03/14 0245 07/04/14 0424  WBC 11.3*  --  18.2* 9.1 9.3 7.1  NEUTROABS 9.2*  --  13.3*  --  7.3  --   HGB 13.0 14.6 13.0 11.4* 11.9* 11.7*  HCT 37.2 43.0 39.8 34.0* 36.0 35.8*  MCV 90.3  --  91.3 88.5 90.0 89.7  PLT 154  --  230 157 173 177   CBG:  Recent Labs Lab 06/30/14 1358 07/01/14 0638 07/02/14 1952  GLUCAP 127* 126* 86    Recent Results (from the past 240 hour(s))  URINE CULTURE     Status: None   Collection Time    06/27/14  5:31 PM      Result Value Ref Range Status     Specimen Description URINE, CATHETERIZED   Final   Special Requests NONE   Final   Culture  Setup Time     Final   Value: 06/28/2014 13:21     Performed at [facility] Count     Final   Value: >=100,000 COLONIES/ML     Performed at [facility]   Culture     Final   Value: ESCHERICHIA COLI     Performed at [facility]   Report Status 07/01/2014 FINAL   Final   Organism ID, Bacteria ESCHERICHIA COLI   Final  URINE CULTURE     Status: None   Collection Time    06/30/14  1:52 PM      Result Value Ref Range Status   Specimen Description URINE, CATHETERIZED   Final   Special Requests ADD [id number]   Final   Culture  Setup Time     Final   Value: 06/30/2014 22:49     Performed at [facility] Count     Final   Value: >=100,000 COLONIES/ML     Performed at [facility]   Culture     Final   Value: ESCHERICHIA COLI     Performed at [facility]   Report Status 07/02/2014 FINAL   Final   Organism ID, Bacteria ESCHERICHIA COLI   Final  CULTURE, BLOOD (ROUTINE X 2)     Status: None   Collection Time    06/30/14  2:54 PM      Result Value Ref Range Status   Specimen Description BLOOD LEFT HAND   Final   Special Requests BOTTLES DRAWN AEROBIC AND ANAEROBIC 6CC   Final   Culture  Setup Time     Final   Value: 06/30/2014 22:21     Performed at [facility]   Culture     Final   Value:        BLOOD CULTURE RECEIVED NO GROWTH TO DATE CULTURE WILL BE HELD FOR 5 DAYS BEFORE ISSUING A FINAL NEGATIVE REPORT     Performed at [facility]   Report Status PENDING   Incomplete  CULTURE, BLOOD (ROUTINE X 2)     Status: None   Collection Time    06/30/14  4:22 PM      Result Value Ref Range Status   Specimen Description BLOOD LEFT HAND  Final   Special Requests BOTTLES DRAWN AEROBIC AND ANAEROBIC 4 CC   Final   Culture  Setup Time     Final   Value: 06/30/2014 22:21     Performed at [facility]    Culture     Final   Value:        BLOOD CULTURE RECEIVED NO GROWTH TO DATE CULTURE WILL BE HELD FOR 5 DAYS BEFORE ISSUING A FINAL NEGATIVE REPORT     Performed at [facility]   Report Status PENDING   Incomplete  MRSA PCR SCREENING     Status: None   Collection Time    07/01/14  6:36 AM      Result Value Ref Range Status   MRSA by PCR NEGATIVE  NEGATIVE Final   Comment:            The GeneXpert MRSA Assay (FDA     approved for NASAL specimens     only), is one component of a     comprehensive MRSA colonization     surveillance program. It is not     intended to diagnose MRSA     infection nor to guide or     monitor treatment for     MRSA infections.     Time spent: 50 minutes  Herberta Pickron, MD, FACP, FHM. [facility] Pager [phone]  If 7PM-7AM, please contact night-coverage [email or web address] Password [facility] 07/04/2014, 11:34 AM    LOS: 4 days

## 2014-07-04 NOTE — Progress Notes (Signed)
Pt does not need BIPAP at this time. RT will continue to monitor. 

## 2014-07-04 NOTE — Progress Notes (Signed)
Speech Language Pathology Treatment: Dysphagia  Patient Details Name: Erika Warren MRN: [id number] DOB: [birth year] Today's Date: 07/04/2014 Time: 8270-7867 SLP Time Calculation (min): 10 min  Assessment / Plan / Recommendation Clinical Impression  Skilled treatment session focused on addressing goals for PO readiness.  Patient resting in bed with private caregiver at bedside.  Patient demonstrated non-meaningful movements in lower extremities, but despite Max multimodal cues SLP was unable to arouse patient.  SLP placed spoon on lower lip which also resulted no meaningful attempts at bilabial closure or manipulation.  Given presentation and current chest x-ray results SLP did not proceed with PO trials today.  Patient's caregiver appeared frustrated with no PO trials given on this date.  SLP re-educated caregiver on aspiration risk with lethargy and advised that family should direct questions regarding nutrition to MD.  Private sitter took notes throughout session.     HPI HPI: 68 year old female patient who is a resident of skilled nursing facility due to severe dementia. She has private sitters to assist in her care who have accompanied her to the hospital. Patient has experienced progressive mental status changes beginning Thursday 06/27/2014. At that time she was brought to emergency department where CT head was unremarkable so she was sent back to the nursing facility. She was returned once again to the emergency department on 06/30/2014 for altered mentation and UIT. At baseline the patient was nonverbal but normally alert and with assistance is able to ambulate. She apparently was on a regular diet without dysphagia precautions prior to admission. After admission nurses found the patient in acute distress noting she was tachycardic with agonal breathing and pallor. Her O2 sats were 70% on room air. She was placed on a nonrebreather mask; patient has been weaned to nasal cannula.      Pertinent Vitals Pain Assessment: No/denies pain  SLP Plan  Continue with current plan of care    Recommendations Diet recommendations: NPO Medication Administration: Via alternative means              Oral Care Recommendations: Oral care Q4 per protocol Follow up Recommendations: 24 hour supervision/assistance;Skilled Nursing facility Plan: Continue with current plan of care    GO    Charlane Ferretti., CCC-SLP [phone]  Erika Warren 07/04/2014, 11:59 AM

## 2014-07-04 NOTE — Progress Notes (Signed)
ANTIBIOTIC CONSULT NOTE - INITIAL  Pharmacy Consult:  Vancomycin / Cefepime >> Zosyn Indication:  PNA  Allergies  Allergen Reactions  . Lorazepam Other (See Comments)    Unknown   . Sulfa Antibiotics     Unknown     Patient Measurements: Height: 5\' 5"  (165.1 cm) Weight: 127 lb 6.8 oz (57.8 kg) IBW/kg (Calculated) : 57  Vital Signs: Temp: 97.2 F (36.2 C) (08/27 1203) Temp src: Axillary (08/27 1203) BP: 121/69 mmHg (08/27 1203) Pulse Rate: 101 (08/27 1203) Intake/Output from previous day: 08/26 0701 - 08/27 0700 In: 950 [I.V.:600; IV Piggyback:350] Out: -  Intake/Output from this shift: Total I/O In: 425 [I.V.:225; IV Piggyback:200] Out: -   Labs:  Recent Labs  07/02/14 0353  07/03/14 0245 07/03/14 0842 07/03/14 2049 07/04/14 0424  WBC 9.1  --  9.3  --   --  7.1  HGB 11.4*  --  11.9*  --   --  11.7*  PLT 157  --  173  --   --  177  CREATININE 0.64  < >  --  0.56 0.60 0.60  < > = values in this interval not displayed. Estimated Creatinine Clearance: 60.6 ml/min (by C-G formula based on Cr of 0.6). No results found for this basename: VANCOTROUGH, Leodis Binet, VANCORANDOM, GENTTROUGH, GENTPEAK, GENTRANDOM, TOBRATROUGH, TOBRAPEAK, TOBRARND, AMIKACINPEAK, AMIKACINTROU, AMIKACIN,  in the last 72 hours   Microbiology: Recent Results (from the past 720 hour(s))  URINE CULTURE     Status: None   Collection Time    06/27/14  5:31 PM      Result Value Ref Range Status   Specimen Description URINE, CATHETERIZED   Final   Special Requests NONE   Final   Culture  Setup Time     Final   Value: 06/28/2014 13:21     Performed at [facility] Count     Final   Value: >=100,000 COLONIES/ML     Performed at [facility]   Culture     Final   Value: ESCHERICHIA COLI     Performed at [facility]   Report Status 07/01/2014 FINAL   Final   Organism ID, Bacteria ESCHERICHIA COLI   Final  URINE CULTURE     Status: None   Collection Time   06/30/14  1:52 PM      Result Value Ref Range Status   Specimen Description URINE, CATHETERIZED   Final   Special Requests ADD [id number]   Final   Culture  Setup Time     Final   Value: 06/30/2014 22:49     Performed at [facility] Count     Final   Value: >=100,000 COLONIES/ML     Performed at [facility]   Culture     Final   Value: ESCHERICHIA COLI     Performed at [facility]   Report Status 07/02/2014 FINAL   Final   Organism ID, Bacteria ESCHERICHIA COLI   Final  CULTURE, BLOOD (ROUTINE X 2)     Status: None   Collection Time    06/30/14  2:54 PM      Result Value Ref Range Status   Specimen Description BLOOD LEFT HAND   Final   Special Requests BOTTLES DRAWN AEROBIC AND ANAEROBIC [facility]   Final   Culture  Setup Time     Final   Value: 06/30/2014 22:21     Performed at [facility]   Culture     Final   Value:        BLOOD CULTURE RECEIVED NO GROWTH TO DATE CULTURE WILL BE HELD FOR 5 DAYS BEFORE ISSUING A FINAL NEGATIVE REPORT     Performed at [facility]   Report Status PENDING   Incomplete  CULTURE, BLOOD (ROUTINE X 2)     Status: None   Collection Time    06/30/14  4:22 PM      Result Value Ref Range Status   Specimen Description BLOOD LEFT HAND   Final   Special Requests BOTTLES DRAWN AEROBIC AND ANAEROBIC 4 CC   Final   Culture  Setup Time     Final   Value: 06/30/2014 22:21     Performed at [facility]   Culture     Final   Value:        BLOOD CULTURE RECEIVED NO GROWTH TO DATE CULTURE WILL BE HELD FOR 5 DAYS BEFORE ISSUING A FINAL NEGATIVE REPORT     Performed at [facility]   Report Status PENDING   Incomplete  MRSA PCR SCREENING     Status: None   Collection Time    07/01/14  6:36 AM      Result Value Ref Range Status   MRSA by PCR NEGATIVE  NEGATIVE Final   Comment:            The GeneXpert MRSA Assay (FDA     approved for NASAL specimens     only), is one component of a      comprehensive MRSA colonization     surveillance program. It is not     intended to diagnose MRSA     infection nor to guide or     monitor treatment for     MRSA infections.    Medical History: Past Medical History  Diagnosis Date  . Hypothyroid   . Allergic rhinitis   . Osteoporosis   . Menopause 2000  . Other and unspecified hyperlipidemia   . Tobacco use disorder   . Rectal polyp 04/2005  . Sigmoid diverticulosis   . Internal hemorrhoids   . Alzheimer's dementia     "late stage" (04/18/2014)  . Nonverbal   . Arthritis     "hands, legs, feet" (04/18/2014)  . Chronic lower back pain       Assessment: 68 YOF brought in from SNF due to AMS and fall, and started on antibiotics for PNA.  Antibiotics to be adjusted to vancomycin and Zosyn for concern of aspiration.  Patient's renal function is stable.  Rochephin 8/24 >> 8/26 Vanc 8/26 >>  Zosyn 8/27 >> Cefepime 8/26 >> 8/27 Azith 8/26 >> 8/27  UCx 8/23 - E.coli BCx 8/23 - NGTD   Goal of Therapy:  Vancomycin trough level 15-20 mcg/ml   Plan:  - Continue Vanc  IV Q12H x8 days total - Zosyn 3.375gm IV Q8H, 4 hr infusion - Monitor renal fxn, clinical course, vanc trough as indicated   Beatriz Settles D. Laney Potash, PharmD, BCPS Pager:  [phone] 07/04/2014, 1:07 PM

## 2014-07-05 ENCOUNTER — Inpatient Hospital Stay (HOSPITAL_COMMUNITY): Payer: Medicare Other

## 2014-07-05 DIAGNOSIS — I428 Other cardiomyopathies: Secondary | ICD-10-CM

## 2014-07-05 DIAGNOSIS — I429 Cardiomyopathy, unspecified: Secondary | ICD-10-CM | POA: Diagnosis present

## 2014-07-05 LAB — CBC
HCT: 38.1 % (ref 36.0–46.0)
Hemoglobin: 12.6 g/dL (ref 12.0–15.0)
MCH: 29.5 pg (ref 26.0–34.0)
MCHC: 33.1 g/dL (ref 30.0–36.0)
MCV: 89.2 fL (ref 78.0–100.0)
PLATELETS: 195 10*3/uL (ref 150–400)
RBC: 4.27 MIL/uL (ref 3.87–5.11)
RDW: 12.7 % (ref 11.5–15.5)
WBC: 7.3 10*3/uL (ref 4.0–10.5)

## 2014-07-05 LAB — BASIC METABOLIC PANEL
ANION GAP: 15 (ref 5–15)
BUN: 9 mg/dL (ref 6–23)
CHLORIDE: 106 meq/L (ref 96–112)
CO2: 20 meq/L (ref 19–32)
CREATININE: 0.57 mg/dL (ref 0.50–1.10)
Calcium: 8.4 mg/dL (ref 8.4–10.5)
GFR calc Af Amer: >90 mL/min (ref >90)
GFR calc non Af Amer: >90 mL/min (ref >90)
Glucose, Bld: 91 mg/dL (ref 70–99)
POTASSIUM: 3.1 meq/L — AB (ref 3.7–5.3)
Sodium: 141 mEq/L (ref 137–147)

## 2014-07-05 LAB — GLUCOSE, CAPILLARY
GLUCOSE-CAPILLARY: 120 mg/dL — AB (ref 70–99)
GLUCOSE-CAPILLARY: 107 mg/dL — AB (ref 70–99)

## 2014-07-05 MED ORDER — LEVOTHYROXINE SODIUM 100 MCG PO TABS
100.0000 ug | ORAL_TABLET | Freq: Every day | ORAL | Status: DC
  Administered 2014-07-06 – 2014-07-08 (×3): 100 ug
  Filled 2014-07-05 (×4): qty 1

## 2014-07-05 MED ORDER — JEVITY 1.2 CAL PO LIQD
1000.0000 mL | ORAL | Status: DC
  Administered 2014-07-05: 16:00:00
  Administered 2014-07-07: 1000 mL
  Filled 2014-07-05 (×5): qty 1000
  Filled 2014-07-05: qty 237
  Filled 2014-07-05: qty 1000

## 2014-07-05 MED ORDER — POTASSIUM CHLORIDE 20 MEQ/15ML (10%) PO LIQD
40.0000 meq | Freq: Three times a day (TID) | ORAL | Status: AC
  Administered 2014-07-05 – 2014-07-06 (×5): 40 meq
  Filled 2014-07-05 (×4): qty 30

## 2014-07-05 MED ORDER — LISINOPRIL 2.5 MG PO TABS
2.5000 mg | ORAL_TABLET | Freq: Every day | ORAL | Status: DC
  Administered 2014-07-06 – 2014-07-08 (×2): 2.5 mg via ORAL
  Filled 2014-07-05 (×3): qty 1

## 2014-07-05 MED ORDER — ACETAMINOPHEN 160 MG/5ML PO SOLN
500.0000 mg | Freq: Three times a day (TID) | ORAL | Status: DC
  Administered 2014-07-05 – 2014-07-08 (×9): 500 mg
  Filled 2014-07-05 (×12): qty 20

## 2014-07-05 MED ORDER — RESOURCE THICKENUP CLEAR PO POWD
ORAL | Status: DC | PRN
  Filled 2014-07-05: qty 125

## 2014-07-05 NOTE — Progress Notes (Addendum)
NUTRITION CONSULT/FOLLOW UP  DOCUMENTATION CODES Per approved criteria  -Not Applicable   INTERVENTION: Initiate Jevity 1.2 formula at 20 ml/hr and increase by 10 ml every 4 hours to goal rate of 50 ml/hr to provide 1440 kcals, 67 gm protein, 968 ml of free water RD to follow for nutrition care plan  NUTRITION DIAGNOSIS: Inadequate oral intake now related to acute encephalopathy as evidenced need for supplemental TF, ongoing  Goal: Pt to meet >/= 90% of their estimated nutrition needs, currently unmet  Monitor:  TF regimen & tolerance, PO intake, weight, labs, I/O's  ASSESSMENT: 68 y.o. female lives in memory care unit, fell on Thursday and was brought to ED, underwent CT head was unremarkable, was brought in again to ED for altered mental status.   Patient currently off BiPAP.  Pt with severe dementia.  Speech Path notes reviewed.  SLP recommending Dys 1-pudding thick liquid diet, however, will need supplemental TF to meet nutrition needs.  Panda tube placement pending.  RD consulted via Adult Tube Feeding Protocol for TF initiation & management.  Height: Ht Readings from Last 1 Encounters:  07/01/14  (1.651 m)    Weight: Wt Readings from Last 1 Encounters:  07/03/14 127 lb 6.8 oz (57.8 kg)    BMI:  Body mass index is 21.2 kg/(m^2).  Estimated Nutritional Needs: Kcal: 1400-1600 Protein: 65-75 gm Fluid: >/= 1.5 L  Skin: Intact  Diet Order: Dysphagia 1, pudding thick liquids   Intake/Output Summary (Last 24 hours) at 07/05/14 1148 Last data filed at 07/05/14 0816  Gross per 24 hour  Intake   1555 ml  Output      0 ml  Net   1555 ml   Labs:   Recent Labs Lab 07/03/14 2049 07/04/14 0424 07/05/14 0424  NA 145 144 141  K 3.6* 3.4* 3.1*  CL 110 110 106  CO2 17* 20 20  BUN CREATININE 0.60 0.60 0.57  CALCIUM 9.1 8.7 8.4  MG  --  1.9  --   GLUCOSE 95 82 91    CBG (last 3)   Recent Labs  07/02/14 1952  GLUCAP 86    Scheduled  Meds: . acetaminophen (TYLENOL) oral liquid 160 mg/5 mL  500 mg Per Tube TID  . albuterol  2.5 mg Nebulization QID  . antiseptic oral rinse  7 mL Mouth Rinse q12n4p  . chlorhexidine  15 mL Mouth Rinse BID  . levothyroxine  50 mcg Intravenous QAC breakfast  . [START ON 07-07-14] lisinopril  2.5 mg Oral Daily  . piperacillin-tazobactam (ZOSYN)  IV  3.375 g Intravenous Q8H  . potassium chloride  40 mEq Per Tube TID  . vancomycin  750 mg Intravenous Q12H    Continuous Infusions: . feeding supplement (JEVITY 1.2 CAL)      Past Medical History  Diagnosis Date  . Hypothyroid   . Allergic rhinitis   . Osteoporosis   . Menopause 2000  . Other and unspecified hyperlipidemia   . Tobacco use disorder   . Rectal polyp 04/2005  . Sigmoid diverticulosis   . Internal hemorrhoids   . Alzheimer's dementia     "late stage" (04/18/2014)  . Nonverbal   . Arthritis     "hands, legs, feet" (04/18/2014)  . Chronic lower back pain     Past Surgical History  Procedure Laterality Date  . Appendectomy    . Tonsillectomy and adenoidectomy    . Cesarean section  1979  . Mouth surgery  6599'J    "don't know why"    Maureen Chatters, RD, LDN Pager #: [phone] After-Hours Pager #: [phone]

## 2014-07-05 NOTE — Progress Notes (Addendum)
Speech Language Pathology Treatment: Dysphagia  Patient Details Name: Erika Warren MRN: [id number] DOB: July 04, 1946 Today's Date: 07/05/2014 Time: 3818-4037 SLP Time Calculation (min): 33 min  Assessment / Plan / Recommendation Clinical Impression  Able to arouse pt. With total verbal/visual/tactile stimulation for awake periods of 3-4 minutes. Today pt. Able to propel puree bolus with delays and decreased manipulation to initiate swallow without indications aspiration, however aspiration likely with large straw sip thin (immediate cough).  She will not be able to meet nutritional needs with puree consistency, therefore recommend PANDA tube feeds with Dys 1 (puree) tray 3 x day to continue facilitation of labial/lingual movements for po feeds. Should be ordered as Dys 1 and pudding thick liquids.   Not sure how realistic sons goals are for his mom.  Recommend continue education.  HPI HPI: 68 year old female patient who is a resident of skilled nursing facility due to severe dementia. She has private sitters to assist in her care who have accompanied her to the hospital. Patient has experienced progressive mental status changes beginning Thursday 06/27/2014. At that time she was brought to emergency department where CT head was unremarkable so she was sent back to the nursing facility. She was returned once again to the emergency department on 06/30/2014 for altered mentation and UIT. At baseline the patient was nonverbal but normally alert and with assistance is able to ambulate. She apparently was on a regular diet without dysphagia precautions prior to admission. After admission nurses found the patient in acute distress noting she was tachycardic with agonal breathing and pallor. Her O2 sats were 70% on room air. She was placed on a nonrebreather mask; patient has been weaned to nasal cannula.     Pertinent Vitals Pain Assessment:  (does not appear to be in pain)  SLP Plan  Continue with  current plan of care    Recommendations Diet recommendations: Dysphagia 1 (puree);Pudding-thick liquid (and PANDA to meet nutrition needs) Medication Administration: Via alternative means Supervision: Full supervision/cueing for compensatory strategies;Staff to assist with self feeding Compensations: Slow rate;Small sips/bites Postural Changes and/or Swallow Maneuvers: Seated upright 90 degrees              Oral Care Recommendations: Oral care BID Follow up Recommendations: 24 hour supervision/assistance;Skilled Nursing facility Plan: Continue with current plan of care    GO     Royce Macadamia 07/05/2014, 9:40 AM [phone]

## 2014-07-05 NOTE — Clinical Social Work Psychosocial (Signed)
Clinical Social Work Department BRIEF PSYCHOSOCIAL ASSESSMENT 07/05/2014  Patient:  Erika Warren, Erika Warren     Account Number:  [id number]     Admit date:  06/30/2014  Clinical Social Worker:  Merlyn Lot, CLINICAL SOCIAL WORKER  Date/Time:  07/05/2014 02:38 PM  Referred by:  Physician  Date Referred:  07/05/2014 Referred for  SNF Placement   Other Referral:   Interview type:  Family Other interview type:    PSYCHOSOCIAL DATA Living Status:  FACILITY Admitted from facility:  [facility] Level of care:  Assisted Living Primary support name:  Erika Warren Primary support relationship to patient:  CHILD, ADULT Degree of support available:   Patients daughter, Erika Warren, reported a high level of support for the patient.    CURRENT CONCERNS Current Concerns  Post-Acute Placement   Other Concerns:   none    SOCIAL WORK ASSESSMENT / PLAN CSW spoke to patients daughter concerning returning to [facility] after discharging from the hospital. Daughter is agreeable to patient returning to the facility. Patient's daughter also reported that patients son, Erika Warren, was the POA and that further conversations should be had with him.   Assessment/plan status:  Psychosocial Support/Ongoing Assessment of Needs Other assessment/ plan:   FL2 update   Information/referral to community resources:   [employer]    PATIENT'S/FAMILY'S RESPONSE TO PLAN OF CARE: Patients family was agreeable to patients return to [facility] and appreciated CSW involvment.       Merlyn Lot, LCSWA Clinical Social Worker [phone]

## 2014-07-05 NOTE — Clinical Social Work Note (Signed)
CSW spoke with Shawna Orleans at [facility] memory care unit [phone] and confirmed that patient can return to [facility] upon discharge.  Merlyn Lot, LCSWA Clinical Social Worker [phone]

## 2014-07-05 NOTE — Progress Notes (Addendum)
Progress Note  Erika Warren [facility] ZGY:[id number] DOB: 1946/09/13 DOA: 06/30/2014 PCP: Pcp Not In System  Brief narrative: 68 year old female patient who is a resident of skilled nursing facility due to severe dementia. She has private sitters to assist in her care who have accompanied her to the hospital. Patient has experienced progressive mental status changes beginning Thursday 8/20. At that time she was brought to the emergency department where CT head was unremarkable so she was sent back to the nursing facility. She was returned once again to the emergency department on 06/30/2014 for altered mentation.  In the ER she was found to have an abnormal urinalysis and mild leukocytosis consistent with likely urinary tract infection. At baseline the patient was nonverbal but normally alert and with assistance is able to ambulate. She apparently was on a regular diet without dysphagia precautions prior to admission.  After admission nurses found the patient in acute distress noting she was tachycardic with agonal breathing and pallor. Her O2 sats were 70% on room air. She was placed on a nonrebreather mask. On-call M.D. evaluated the patient at the bedside and transfered the patient to the step down ICU. She did require one time nasotracheal suctioning at which time a mucoid plug was returned. She has remained somnolent despite these measures. ABG revealed respiratory acidosis with a PCO2 of 75. Her family was updated at that time.  Assessment/Plan:   Acute toxic metabolic and hypercapnic encephalopathy Still somewhat somnolent, but arousable. At baseline nonverbal and sleeps much of the day.    Acute respiratory failure with hypoxia and hypercarbia Likely aspiration pneumonia. sats normal off bipap. Code status DNR  Aspiration pneumonia versus HCAP Continue vanc and zosyn for now.  Speech therapy recommending D1 pudding thick liquids and temporary dobhoff/TF.  Discussed with PCP. Goal is to  transfer back to facility early next week. No permanent feeding tube. If patient were to deteriorate, PCP will likely d/w family do not hospitalize and make comfort care.  Patient was on hospice earlier in the year, but per son, "was a disaster", and he sent hospice away after a week.  Son became very agitated when I asked about it.    Recurrent E Coli UTI  On abx    Dehydration with hypernatremia Saline lock    Hypothyroid -Continue Synthroid     Advanced Alzheimer's Dementia Son voices understanding patient may continue to decline   OA Change to tid scheduled  PSVT versus NSVT - patient had tachycardia in the 160s on 8/24, likely secondary to acute respiratory failure and acute illness. She had been placed on IV metoprolol scheduled. Currently reverted to sinus rhythm but had an episode of nonsustained wide complex tachycardia on 8/26 at 5:05 PM.? PSVT versus NSVT. Also soft blood pressures. Change IV metoprolol to when necessary. TSH low. Replace potassium. Magnesium 1.9.  Echo shows EF 30% and WMA. No old for comparison.  No evidence of pulmonary edema, but saline lock  Hypokalemia replete  Anemia - Stable   DVT prophylaxis: Patient's son wishes to DC Lovenox and use SCDs. Code Status: DO NOT RESUSCITATE Family Communication:  Son. Also d/w pcp Disposition Plan/Expected LOS:  Likely SNF early next week   Consultants: None  Procedures:  BiPAP   Antibiotics: Rocephin 8/23 >> 8/26   IV vancomycin 8/26 > IV cefepime 8/26 > 8/27 IV azithromycin 8/26 > 8/27 IV Zosyn 8/27 >.  Objective: Blood pressure 119/68, pulse 108, temperature 97.3 F (36.3 C), temperature source Axillary,  resp. rate 15, height  (1.651 m), weight 57.8 kg (127 lb 6.8 oz), SpO2 97.00%.  Intake/Output Summary (Last 24 hours) at 07/05/14 1103 Last data filed at 07/05/14 0816  Gross per 24 hour  Intake   1555 ml  Output      0 ml  Net   1555 ml   Exam: Gen:  Asleep. Briefly arousable.  Nonverbal. Breathing nonlabored Chest:  CTA without WRR Cardiac: RRR without MGR Abdomen: Soft nontender nondistended without obvious hepatosplenomegaly, no ascites Extremities: no CCE  Scheduled Meds:  Scheduled Meds: . acetaminophen (TYLENOL) oral liquid 160 mg/5 mL  500 mg Per Tube TID  . albuterol  2.5 mg Nebulization QID  . antiseptic oral rinse  7 mL Mouth Rinse q12n4p  . chlorhexidine  15 mL Mouth Rinse BID  . levothyroxine  50 mcg Intravenous QAC breakfast  . [START ON 07/21/2014] lisinopril  2.5 mg Oral Daily  . piperacillin-tazobactam (ZOSYN)  IV  3.375 g Intravenous Q8H  . potassium chloride  40 mEq Per Tube TID  . vancomycin  750 mg Intravenous Q12H   Data Reviewed: Basic Metabolic Panel:  Recent Labs Lab 07/02/14 2152 07/03/14 0842 07/03/14 2049 07/04/14 0424 07/05/14 0424  NA 150* 151* 145 144 141  K 3.5* 3.4* 3.6* 3.4* 3.1*  CL 116* 114* 110 110 106  CO2 20 19 17* 20 20  GLUCOSE 91 79 95 82 91  BUN CREATININE 0.60 0.56 0.60 0.60 0.57  CALCIUM 9.3 8.8 9.1 8.7 8.4  MG  --   --   --  1.9  --    Liver Function Tests:  Recent Labs Lab 06/30/14 1454 07/01/14 0609 07/02/14 2152  AST ALT ALKPHOS 67 71 65  BILITOT 0.4 0.3 0.3  PROT 6.9 7.0 6.3  ALBUMIN 3.0* 3.0* 2.4*   CBC:  Recent Labs Lab 06/30/14 1454  07/01/14 0609 07/02/14 0353 07/03/14 0245 07/04/14 0424 07/05/14 0424  WBC 11.3*  --  18.2* 9.1 9.3 7.1 7.3  NEUTROABS 9.2*  --  13.3*  --  7.3  --   --   HGB 13.0  < > 13.0 11.4* 11.9* 11.7* 12.6  HCT 37.2  < > 39.8 34.0* 36.0 35.8* 38.1  MCV 90.3  --  91.3 88.5 90.0 89.7 89.2  PLT 154  --  230 157 173 177 195  < > = values in this interval not displayed. CBG:  Recent Labs Lab 06/30/14 1358 07/01/14 0638 07/02/14 1952  GLUCAP 127* 126* 86    Recent Results (from the past 240 hour(s))  URINE CULTURE     Status: None   Collection Time    06/27/14  5:31 PM      Result Value Ref Range Status    Specimen Description URINE, CATHETERIZED   Final   Special Requests NONE   Final   Culture  Setup Time     Final   Value: 06/28/2014 13:21     Performed at [facility] Count     Final   Value: >=100,000 COLONIES/ML     Performed at [facility]   Culture     Final   Value: ESCHERICHIA COLI     Performed at [facility]   Report Status 07/01/2014 FINAL   Final   Organism ID, Bacteria ESCHERICHIA COLI   Final  URINE CULTURE     Status: None  Collection Time    06/30/14  1:52 PM      Result Value Ref Range Status   Specimen Description URINE, CATHETERIZED   Final   Special Requests ADD [id number]   Final   Culture  Setup Time     Final   Value: 06/30/2014 22:49     Performed at [facility]   Colony Count     Final   Value: >=100,000 COLONIES/ML     Performed at [facility]   Culture     Final   Value: ESCHERICHIA COLI     Performed at [facility]   Report Status 07/02/2014 FINAL   Final   Organism ID, Bacteria ESCHERICHIA COLI   Final  CULTURE, BLOOD (ROUTINE X 2)     Status: None   Collection Time    06/30/14  2:54 PM      Result Value Ref Range Status   Specimen Description BLOOD LEFT HAND   Final   Special Requests BOTTLES DRAWN AEROBIC AND ANAEROBIC [facility]   Final   Culture  Setup Time     Final   Value: 06/30/2014 22:21     Performed at [facility]   Culture     Final   Value:        BLOOD CULTURE RECEIVED NO GROWTH TO DATE CULTURE WILL BE HELD FOR 5 DAYS BEFORE ISSUING A FINAL NEGATIVE REPORT     Performed at [facility]   Report Status PENDING   Incomplete  CULTURE, BLOOD (ROUTINE X 2)     Status: None   Collection Time    06/30/14  4:22 PM      Result Value Ref Range Status   Specimen Description BLOOD LEFT HAND   Final   Special Requests BOTTLES DRAWN AEROBIC AND ANAEROBIC 4 CC   Final   Culture  Setup Time     Final   Value: 06/30/2014 22:21     Performed at [facility]    Culture     Final   Value:        BLOOD CULTURE RECEIVED NO GROWTH TO DATE CULTURE WILL BE HELD FOR 5 DAYS BEFORE ISSUING A FINAL NEGATIVE REPORT     Performed at [facility]   Report Status PENDING   Incomplete  MRSA PCR SCREENING     Status: None   Collection Time    07/01/14  6:36 AM      Result Value Ref Range Status   MRSA by PCR NEGATIVE  NEGATIVE Final   Comment:            The GeneXpert MRSA Assay (FDA     approved for NASAL specimens     only), is one component of a     comprehensive MRSA colonization     surveillance program. It is not     intended to diagnose MRSA     infection nor to guide or     monitor treatment for     MRSA infections.     Time spent: 50 minutes  Christiane Ha, MD [facility] Pager [phone]  If 7PM-7AM, please contact night-coverage [email or web address] Password [email or web address] 07/05/2014, 11:03 AM    LOS: 5 days

## 2014-07-06 LAB — CULTURE, BLOOD (ROUTINE X 2)
CULTURE: NO GROWTH
Culture: NO GROWTH

## 2014-07-06 LAB — BASIC METABOLIC PANEL WITH GFR
Anion gap: 13 (ref 5–15)
BUN: 9 mg/dL (ref 6–23)
CO2: 22 meq/L (ref 19–32)
Calcium: 9.2 mg/dL (ref 8.4–10.5)
Chloride: 107 meq/L (ref 96–112)
Creatinine, Ser: 0.66 mg/dL (ref 0.50–1.10)
GFR calc Af Amer: >90 mL/min (ref >90)
GFR calc non Af Amer: 89 mL/min — ABNORMAL LOW (ref >90)
Glucose, Bld: 106 mg/dL — ABNORMAL HIGH (ref 70–99)
Potassium: 3.7 meq/L (ref 3.7–5.3)
Sodium: 142 meq/L (ref 137–147)

## 2014-07-06 LAB — GLUCOSE, CAPILLARY
Glucose-Capillary: 105 mg/dL — ABNORMAL HIGH (ref 70–99)
Glucose-Capillary: 107 mg/dL — ABNORMAL HIGH (ref 70–99)
Glucose-Capillary: 112 mg/dL — ABNORMAL HIGH (ref 70–99)
Glucose-Capillary: 139 mg/dL — ABNORMAL HIGH (ref 70–99)
Glucose-Capillary: 140 mg/dL — ABNORMAL HIGH (ref 70–99)

## 2014-07-06 MED ORDER — IBUPROFEN 100 MG/5ML PO SUSP
400.0000 mg | Freq: Four times a day (QID) | ORAL | Status: DC | PRN
  Administered 2014-07-07 – 2014-07-08 (×3): 400 mg via ORAL
  Filled 2014-07-06 (×7): qty 20

## 2014-07-06 NOTE — Progress Notes (Signed)
Progress Note  Wileen Duncanson [facility] ZOX:[id number] DOB: 1946/02/04 DOA: 06/30/2014 PCP: Pcp Not In System  Brief narrative: 68 year old female patient who is a resident of skilled nursing facility due to severe dementia. She has private sitters to assist in her care who have accompanied her to the hospital. Patient has experienced progressive mental status changes beginning Thursday 8/20. At that time she was brought to the emergency department where CT head was unremarkable so she was sent back to the nursing facility. She was returned once again to the emergency department on 06/30/2014 for altered mentation.  In the ER she was found to have an abnormal urinalysis and mild leukocytosis consistent with likely urinary tract infection. At baseline the patient was nonverbal but normally alert and with assistance is able to ambulate. She apparently was on a regular diet without dysphagia precautions prior to admission.  After admission nurses found the patient in acute distress noting she was tachycardic with agonal breathing and pallor. Her O2 sats were 70% on room air. She was placed on a nonrebreather mask. On-call M.D. evaluated the patient at the bedside and transfered the patient to the step down ICU. She did require one time nasotracheal suctioning at which time a mucoid plug was returned. She has remained somnolent despite these measures. ABG revealed respiratory acidosis with a PCO2 of 75. Her family was updated at that time.  Assessment/Plan:   Acute toxic metabolic and hypercapnic encephalopathy Still somewhat somnolent, but arousable. At baseline nonverbal and sleeps much of the day.    Acute respiratory failure with hypoxia and hypercarbia Likely aspiration pneumonia. sats normal off bipap. Code status DNR  Likely Aspiration pneumonia  Continue vanc and zosyn for now.  Speech therapy recommending D1 pudding thick liquids and temporary dobhoff/TF.  Discussed with PCP. Goal is to  transfer back to facility early next week. No permanent feeding tube. If patient were to deteriorate, PCP will likely d/w family do not hospitalize and make comfort care.  Patient was on hospice earlier in the year, but per son, "was a disaster", and he sent hospice away after a week.  D/c vancomycin. Continue zosyn    Recurrent E Coli UTI  On abx    Dehydration with hypernatremia Saline lock    Hypothyroid -Continue Synthroid     Advanced Alzheimer's Dementia Son voices understanding patient may continue to decline   OA Change to tid scheduled tylenol. Son demanding "antiinflamatory". Will give prn ibuprofen.    PSVT versus NSVT - patient had tachycardia in the 160s on 8/24, likely secondary to acute respiratory failure and acute illness. She had been placed on IV metoprolol scheduled. Currently reverted to sinus rhythm but had an episode of nonsustained wide complex tachycardia on 8/26 at 5:05 PM.? PSVT versus NSVT. Also soft blood pressures. Change IV metoprolol to when necessary. TSH low. Replace potassium. Magnesium 1.9.  Echo shows EF 30% and WMA. No old for comparison.  No evidence of pulmonary edema  Hypokalemia replete  Anemia - Stable   DVT prophylaxis: Patient's son wishes to DC Lovenox and use SCDs. Code Status: DO NOT RESUSCITATE Family Communication:  Son. Also d/w pcp Disposition Plan/Expected LOS:  Likely SNF early next week. Transfer to floor today   Consultants: None  Procedures:  BiPAP   Antibiotics: Rocephin 8/23 >> 8/26   IV vancomycin 8/26 > IV cefepime 8/26 > 8/27 IV azithromycin 8/26 > 8/27 IV Zosyn 8/27 >.  Objective: Blood pressure 109/56, pulse 103, temperature 97.9  F (36.6 C), temperature source Axillary, resp. rate 14, height 5\' 5"  (1.651 m), weight 57.153 kg (126 lb), SpO2 99.00%.  Intake/Output Summary (Last 24 hours) at 06/10/2014 1120 Last data filed at 06/28/2014 0725  Gross per 24 hour  Intake    744 ml  Output      0 ml  Net     744 ml   Exam: Gen:  Asleep. Briefly arousable. Nonverbal. Breathing nonlabored. Occasional cough. Chest:  CTA without WRR Cardiac: RRR without MGR Abdomen: Soft nontender nondistended without obvious hepatosplenomegaly, no ascites Extremities: no CCE  Scheduled Meds:  Scheduled Meds: . acetaminophen (TYLENOL) oral liquid 160 mg/5 mL  500 mg Per Tube TID  . albuterol  2.5 mg Nebulization QID  . antiseptic oral rinse  7 mL Mouth Rinse q12n4p  . chlorhexidine  15 mL Mouth Rinse BID  . levothyroxine  100 mcg Per Tube QAC breakfast  . lisinopril  2.5 mg Oral Daily  . piperacillin-tazobactam (ZOSYN)  IV  3.375 g Intravenous Q8H  . potassium chloride  40 mEq Per Tube TID  . vancomycin  750 mg Intravenous Q12H   Data Reviewed: Basic Metabolic Panel:  Recent Labs Lab 07/03/14 0842 07/03/14 2049 07/04/14 0424 07/05/14 0424 07/01/2014 0343  NA 151* 145 144 141 142  K 3.4* 3.6* 3.4* 3.1* 3.7  CL 114* 110 110 106 107  CO2 19 17* 20 20 22   GLUCOSE 79 95 82 91 106*  BUN 15 14 14 9 9   CREATININE 0.56 0.60 0.60 0.57 0.66  CALCIUM 8.8 9.1 8.7 8.4 9.2  MG  --   --  1.9  --   --    Liver Function Tests:  Recent Labs Lab 06/30/14 1454 07/01/14 0609 07/02/14 2152  AST 18 15 25   ALT 19 16 18   ALKPHOS 67 71 65  BILITOT 0.4 0.3 0.3  PROT 6.9 7.0 6.3  ALBUMIN 3.0* 3.0* 2.4*   CBC:  Recent Labs Lab 06/30/14 1454  07/01/14 0609 07/02/14 0353 07/03/14 0245 07/04/14 0424 07/05/14 0424  WBC 11.3*  --  18.2* 9.1 9.3 7.1 7.3  NEUTROABS 9.2*  --  13.3*  --  7.3  --   --   HGB 13.0  < > 13.0 11.4* 11.9* 11.7* 12.6  HCT 37.2  < > 39.8 34.0* 36.0 35.8* 38.1  MCV 90.3  --  91.3 88.5 90.0 89.7 89.2  PLT 154  --  230 157 173 177 195  < > = values in this interval not displayed. CBG:  Recent Labs Lab 07/05/14 1738 07/05/14 1948 07/05/14 2354 06/20/2014 0351 06/11/2014 0752  GLUCAP 120* 107* 139* 105* 140*    Recent Results (from the past 240 hour(s))  URINE CULTURE     Status:  None   Collection Time    06/27/14  5:31 PM      Result Value Ref Range Status   Specimen Description URINE, CATHETERIZED   Final   Special Requests NONE   Final   Culture  Setup Time     Final   Value: 06/28/2014 13:21     Performed at [facility] Count     Final   Value: >=100,000 COLONIES/ML     Performed at [facility]   Culture     Final   Value: ESCHERICHIA COLI     Performed at [facility]   Report Status 07/01/2014 FINAL   Final   Organism ID, Bacteria ESCHERICHIA COLI  Final  URINE CULTURE     Status: None   Collection Time    06/30/14  1:52 PM      Result Value Ref Range Status   Specimen Description URINE, CATHETERIZED   Final   Special Requests ADD [id number]   Final   Culture  Setup Time     Final   Value: 06/30/2014 22:49     Performed at [facility]   Colony Count     Final   Value: >=100,000 COLONIES/ML     Performed at [facility]   Culture     Final   Value: ESCHERICHIA COLI     Performed at [facility]   Report Status 07/02/2014 FINAL   Final   Organism ID, Bacteria ESCHERICHIA COLI   Final  CULTURE, BLOOD (ROUTINE X 2)     Status: None   Collection Time    06/30/14  2:54 PM      Result Value Ref Range Status   Specimen Description BLOOD LEFT HAND   Final   Special Requests BOTTLES DRAWN AEROBIC AND ANAEROBIC [facility]   Final   Culture  Setup Time     Final   Value: 06/30/2014 22:21     Performed at [facility]   Culture     Final   Value:        BLOOD CULTURE RECEIVED NO GROWTH TO DATE CULTURE WILL BE HELD FOR 5 DAYS BEFORE ISSUING A FINAL NEGATIVE REPORT     Performed at [facility]   Report Status PENDING   Incomplete  CULTURE, BLOOD (ROUTINE X 2)     Status: None   Collection Time    06/30/14  4:22 PM      Result Value Ref Range Status   Specimen Description BLOOD LEFT HAND   Final   Special Requests BOTTLES DRAWN AEROBIC AND ANAEROBIC 4 CC   Final   Culture  Setup  Time     Final   Value: 06/30/2014 22:21     Performed at [facility]   Culture     Final   Value:        BLOOD CULTURE RECEIVED NO GROWTH TO DATE CULTURE WILL BE HELD FOR 5 DAYS BEFORE ISSUING A FINAL NEGATIVE REPORT     Performed at [facility]   Report Status PENDING   Incomplete  MRSA PCR SCREENING     Status: None   Collection Time    07/01/14  6:36 AM      Result Value Ref Range Status   MRSA by PCR NEGATIVE  NEGATIVE Final   Comment:            The GeneXpert MRSA Assay (FDA     approved for NASAL specimens     only), is one component of a     comprehensive MRSA colonization     surveillance program. It is not     intended to diagnose MRSA     infection nor to guide or     monitor treatment for     MRSA infections.     Time spent: 50 minutes  Christiane Ha, MD [facility] Pager [phone]  If 7PM-7AM, please contact night-coverage [email or web address] Password [email or web address] July 31, 2014, 11:20 AM    LOS: 6 days

## 2014-07-07 LAB — GLUCOSE, CAPILLARY: Glucose-Capillary: 93 mg/dL (ref 70–99)

## 2014-07-07 MED ORDER — ALBUTEROL SULFATE (2.5 MG/3ML) 0.083% IN NEBU
2.5000 mg | INHALATION_SOLUTION | Freq: Two times a day (BID) | RESPIRATORY_TRACT | Status: DC
  Administered 2014-07-07 – 2014-07-08 (×2): 2.5 mg via RESPIRATORY_TRACT
  Filled 2014-07-07 (×2): qty 3

## 2014-07-07 MED ORDER — AMOXICILLIN-POT CLAVULANATE 250-62.5 MG/5ML PO SUSR
500.0000 mg | Freq: Three times a day (TID) | ORAL | Status: DC
  Administered 2014-07-07 – 2014-07-08 (×2): 500 mg
  Filled 2014-07-07 (×5): qty 10

## 2014-07-07 NOTE — Progress Notes (Signed)
Progress Note  [facility] ZOX:[id number] DOB: [birth year] DOA: 06/30/2014 PCP: Pcp Not In System  Brief narrative: 68 year old female patient who is a resident of skilled nursing facility due to severe dementia. She has private sitters to assist in her care who have accompanied her to the hospital. Patient has experienced progressive mental status changes beginning Thursday 8/20. At that time she was brought to the emergency department where CT head was unremarkable so she was sent back to the nursing facility. She was returned once again to the emergency department on 06/30/2014 for altered mentation.  In the ER she was found to have an abnormal urinalysis and mild leukocytosis consistent with likely urinary tract infection. At baseline the patient was nonverbal but normally alert and with assistance is able to ambulate. She apparently was on a regular diet without dysphagia precautions prior to admission.  After admission nurses found the patient in acute distress noting she was tachycardic with agonal breathing and pallor. Her O2 sats were 70% on room air. She was placed on a nonrebreather mask. On-call M.D. evaluated the patient at the bedside and transfered the patient to the step down ICU. She did require one time nasotracheal suctioning at which time a mucoid plug was returned. She has remained somnolent despite these measures. ABG revealed respiratory acidosis with a PCO2 of 75. Her family was updated at that time.  Assessment/Plan:   Acute toxic metabolic and hypercapnic encephalopathy Still somewhat somnolent, but arousable. At baseline nonverbal and sleeps much of the day.    Acute respiratory failure with hypoxia and hypercarbia Likely aspiration pneumonia. sats normal off bipap. Code status DNR  Likely Aspiration pneumonia  Lost IV access. Change to augmentin    Recurrent E Coli UTI  On abx    Dehydration with hypernatremia Saline lock    Hypothyroid -Continue  Synthroid     Advanced Alzheimer's Dementia Son voices understanding patient may continue to decline   OA Change to tid scheduled tylenol. Son demanding "antiinflamatory". Will give prn ibuprofen.    PSVT versus NSVT - patient had tachycardia in the 160s on 8/24, likely secondary to acute respiratory failure and acute illness. She had been placed on IV metoprolol scheduled. Currently reverted to sinus rhythm but had an episode of nonsustained wide complex tachycardia on 8/26 at 5:05 PM.? PSVT versus NSVT. Also soft blood pressures. Change IV metoprolol to when necessary. TSH low. Replace potassium. Magnesium 1.9.  Echo shows EF 30% and WMA. No old for comparison.  No evidence of pulmonary edema  Hypokalemia replete  Anemia - Stable   DVT prophylaxis: Patient's son wishes to DC Lovenox and use SCDs. Code Status: DO NOT RESUSCITATE Family Communication:  Son. Also d/w pcp Disposition Plan/Expected LOS:  SNF in am if stable   Consultants: None  Procedures:  BiPAP   Antibiotics: Rocephin 8/23 >> 8/26   IV vancomycin 8/26 > IV cefepime 8/26 > 8/27 IV azithromycin 8/26 > 8/27 IV Zosyn 8/27 >.  Objective: Blood pressure 104/53, pulse 83, temperature 98.8 F (37.1 C), temperature source Axillary, resp. rate 18, height  (1.651 m), weight 57.153 kg (126 lb), SpO2 95.00%.  Intake/Output Summary (Last 24 hours) at 07/07/14 2104 Last data filed at 07/07/14 1420  Gross per 24 hour  Intake 1035.66 ml  Output      0 ml  Net 1035.66 ml   Exam: Gen:  Asleep. Briefly arousable. Nonverbal. Breathing nonlabored.  Chest:  CTA without WRR Cardiac: RRR without  MGR Abdomen: Soft nontender nondistended without obvious hepatosplenomegaly, no ascites Extremities: no CCE  Scheduled Meds:  Scheduled Meds: . acetaminophen (TYLENOL) oral liquid 160 mg/5 mL  500 mg Per Tube TID  . albuterol  2.5 mg Nebulization BID  . amoxicillin-clavulanate  500 mg Per Tube 3 times per day  .  antiseptic oral rinse  7 mL Mouth Rinse q12n4p  . chlorhexidine  15 mL Mouth Rinse BID  . levothyroxine  100 mcg Per Tube QAC breakfast  . lisinopril  2.5 mg Oral Daily   Data Reviewed: Basic Metabolic Panel:  Recent Labs Lab 07/03/14 0842 07/03/14 2049 07/04/14 0424 07/05/14 0424 06/19/2014 0343  NA 151* 145 144 141 142  K 3.4* 3.6* 3.4* 3.1* 3.7  CL 114* 110 110 106 107  CO2 19 17* 20 20 22   GLUCOSE 79 95 82 91 106*  BUN 15 14 14 9 9   CREATININE 0.56 0.60 0.60 0.57 0.66  CALCIUM 8.8 9.1 8.7 8.4 9.2  MG  --   --  1.9  --   --    Liver Function Tests:  Recent Labs Lab 07/01/14 0609 07/02/14 2152  AST 15 25  ALT 16 18  ALKPHOS 71 65  BILITOT 0.3 0.3  PROT 7.0 6.3  ALBUMIN 3.0* 2.4*   CBC:  Recent Labs Lab 07/01/14 0609 07/02/14 0353 07/03/14 0245 07/04/14 0424 07/05/14 0424  WBC 18.2* 9.1 9.3 7.1 7.3  NEUTROABS 13.3*  --  7.3  --   --   HGB 13.0 11.4* 11.9* 11.7* 12.6  HCT 39.8 34.0* 36.0 35.8* 38.1  MCV 91.3 88.5 90.0 89.7 89.2  PLT 230 157 173 177 195   CBG:  Recent Labs Lab 06/16/2014 0351 06/29/2014 0752 07/04/2014 1157 06/17/2014 2206 07/07/14 1159  GLUCAP 105* 140* 112* 107* 93    Recent Results (from the past 240 hour(s))  URINE CULTURE     Status: None   Collection Time    06/30/14  1:52 PM      Result Value Ref Range Status   Specimen Description URINE, CATHETERIZED   Final   Special Requests ADD [id number]   Final   Culture  Setup Time     Final   Value: 06/30/2014 22:49     Performed at [facility] Count     Final   Value: >=100,000 COLONIES/ML     Performed at [facility]   Culture     Final   Value: ESCHERICHIA COLI     Performed at [facility]   Report Status 07/02/2014 FINAL   Final   Organism ID, Bacteria ESCHERICHIA COLI   Final  CULTURE, BLOOD (ROUTINE X 2)     Status: None   Collection Time    06/30/14  2:54 PM      Result Value Ref Range Status   Specimen Description BLOOD LEFT HAND    Final   Special Requests BOTTLES DRAWN AEROBIC AND ANAEROBIC [facility]   Final   Culture  Setup Time     Final   Value: 06/30/2014 22:21     Performed at [facility]   Culture     Final   Value: NO GROWTH 5 DAYS     Performed at [facility]   Report Status 07/05/2014 FINAL   Final  CULTURE, BLOOD (ROUTINE X 2)     Status: None   Collection Time    06/30/14  4:22 PM  Result Value Ref Range Status   Specimen Description BLOOD LEFT HAND   Final   Special Requests BOTTLES DRAWN AEROBIC AND ANAEROBIC 4 CC   Final   Culture  Setup Time     Final   Value: 06/30/2014 22:21     Performed at [facility]   Culture     Final   Value: NO GROWTH 5 DAYS     Performed at [facility]   Report Status 07-21-2014 FINAL   Final  MRSA PCR SCREENING     Status: None   Collection Time    07/01/14  6:36 AM      Result Value Ref Range Status   MRSA by PCR NEGATIVE  NEGATIVE Final   Comment:            The GeneXpert MRSA Assay (FDA     approved for NASAL specimens     only), is one component of a     comprehensive MRSA colonization     surveillance program. It is not     intended to diagnose MRSA     infection nor to guide or     monitor treatment for     MRSA infections.     Time spent: 25 minutes  Christiane Ha, MD [facility] Pager [phone]  If 7PM-7AM, please contact night-coverage [email or web address] Password [email or web address] 07/07/2014, 9:04 PM    LOS: 7 days

## 2014-07-08 MED ORDER — LISINOPRIL 2.5 MG PO TABS: 2.5000 mg | ORAL_TABLET | Freq: Every day | ORAL | Status: AC

## 2014-07-08 NOTE — Discharge Summary (Signed)
Physician Discharge Summary  Aldena Worm [facility] ZOX:[id number] DOB: 1946-04-20 DOA: 06/30/2014  PCP: Herschel Senegal, MD  Admit date: 06/30/2014 Discharge date: 07/08/2014  Time spent: greater than 30 minutes  Recommendations for Outpatient Follow-up:  1. Monitor BMET 2. Continue reasonable goals of care discussions  Discharge Diagnoses:  Principal Problem:   Acute toxic encephalopathy Active Problems: Aspiration pneumonia dysphagia   Hypothyroid   Advanced alzheimers Dementia NSVT Cardiomyopathy without CHF   E coli UTI (urinary tract infection)   Acute respiratory failure with hypoxia and hypercarbia dehydration hypokalemia   Aspiration pneumonia   Cardiomyopathy  Discharge Condition: stable  Filed Weights   07/05/14 2116 06/21/2014 0352 07/08/14 0506  Weight: 59.1 kg (130 lb 4.7 oz) 57.153 kg (126 lb) 61.236 kg (135 lb)    History of present illness/Hospital course 68 year old female patient who is a resident of skilled nursing facility due to severe dementia. She has private sitters to assist in her care who have accompanied her to the hospital. Patient has experienced decreased level of conciousness beginning Thursday 8/20. At baseline, she is nonverbal and essentially bedbound.  In the emergency department where CT head was unremarkable so she was sent back to the nursing facility. She was returned once again to the emergency department on 06/30/2014 for altered mentation.  In the ER she was found to have an abnormal urinalysis and mild leukocytosis consistent with likely urinary tract infection. After admission, nurses found the patient in acute distress noting she was tachycardic with agonal breathing and pallor. Her O2 sats were 70% on room air. She was placed on a nonrebreather mask. On-call M.D. evaluated the patient at the bedside and transfered the patient to the step down ICU. She did require one time nasotracheal suctioning at which time a mucoid plug was returned.   ABG revealed respiratory acidosis with a PCO2 of 75.   Acute toxic metabolic and hypercapnic encephalopathy  Now essentially back to baseline per aide and family, which is nonverbal, somnolent much of the day and night  Acute respiratory failure with hypoxia and hypercarbia  Repeat CXR showed pneumonia. Likely aspirated.  Resolved. Oxygen saturations in the 90s after treatment with BiPAP and antibiotics  Aspiration pneumonia  Has completed course of antibiotics. Speech therapy recommends pureed diet with pudding thickened liquids and strict aspiration precautions. Patient received NG tube feedings short term to support nutrition while encephalopathic.  Patient is now eating about 50% of meals with much coaxing.  If patient should continue fail to thrive, or have recurrent aspiration events, Dr. Leanor Rubenstein will discuss with family transitioning to comfort care/do not hospitalize status.  Recurrent E Coli UTI  Sensitive to most antibiotics. Completed course of antibiotics  Dehydration with hypernatremia  Now euvolemic  Hypothyroid  -Continue Synthroid - TSH 0.100 with normal T4 and low T3 consistent with sick euthyroid syndrome. Recommend repeating TSH in 4-6 weeks. No change in Synthroid dose at this time.   Advanced Alzheimer's Dementia  Code status remains DNR. Goals of care discussions should continue  PSVT versus NSVT  - patient had tachycardia in the 160s on 8/24, likely secondary to acute respiratory failure, hypokalemia and acute illness. Resolved with IV metoprolol. Converted sinus rhythm but had an episode of nonsustained wide complex tachycardia on 8/26 at 5:05 PM.? PSVT versus NSVT. Echocardiogram shows EF 30%. No evidence of CHF. Low dose ACE inhibitor started. Not a candidate for further cardiac testing.  No old echo for comparison.  Hypokalemia  corrected  Procedures:  BiPAP  Consultations:  none  Discharge Exam: Filed Vitals:   07/08/14 0506  BP: 115/59  Pulse: 81   Temp: 97.2 F (36.2 C)  Resp: 16    General: asleep. Arousable. Appears comfortable Cardiovascular: RRR without MGR Respiratory: CTA without WRR Abd: s, nt, nd Ext no CCE    Discharge Instructions   Bed rest    Complete by:  As directed      Discharge instructions    Complete by:  As directed   Pureed diet with pudding thickened liquids            Medication List    STOP taking these medications       HYDROcodone-acetaminophen 5-325 MG per tablet  Commonly known as:  NORCO/VICODIN      TAKE these medications       acetaminophen 160 MG/5ML suspension  Commonly known as:  TYLENOL  Take 500 mg by mouth 3 (three) times daily.     aspirin 81 MG chewable tablet  Chew 81 mg by mouth daily.     Cranberry Fruit 475 MG Caps  Take 475 mg by mouth 2 (two) times daily. May open and mix with food     ibuprofen 100 MG/5ML suspension  Commonly known as:  ADVIL,MOTRIN  Take 400 mg by mouth every 6 (six) hours as needed for mild pain.     levothyroxine 100 MCG tablet  Commonly known as:  SYNTHROID, LEVOTHROID  Take 1 tablet (100 mcg total) by mouth daily before breakfast.     lisinopril 2.5 MG tablet  Commonly known as:  PRINIVIL,ZESTRIL  Take 1 tablet (2.5 mg total) by mouth daily.     Melatonin 5 MG Tabs  Take 5 mg by mouth at bedtime as needed (sleep).     memantine 10 MG tablet  Commonly known as:  NAMENDA  Take 10 mg by mouth 2 (two) times daily.     MULTIVITAMIN GUMMIES ADULT PO  Take 1 each by mouth daily.     rosuvastatin 20 MG tablet  Commonly known as:  CRESTOR  Take 20 mg by mouth daily.     Vitamin D3 1000 UNITS Chew  Chew 1 tablet by mouth 3 (three) times daily.       Allergies  Allergen Reactions  . Lorazepam Other (See Comments)    Unknown   . Sulfa Antibiotics     Unknown        Follow-up Information   Follow up with Pcp Not In System.      Follow up with Herschel Senegal, MD In 1 week.   Specialties:  Internal Medicine, Geriatric  Medicine   Contact information:   [address] [place] [address] [phone]        The results of significant diagnostics from this hospitalization (including imaging, microbiology, ancillary and laboratory) are listed below for reference.    Significant Diagnostic Studies: Dg Hip Complete Left  06/27/2014   CLINICAL DATA:  Larey Seat on left hip and hit head in stage Alzheimer's.  EXAM: LEFT HIP - COMPLETE 2+ VIEW  COMPARISON:  None.  FINDINGS: Mild osteoarthritis involves the left hip. No fracture or dislocation. There is moderate degenerative joint disease involving the right hip. Degenerative disc disease is identified in the lower lumbar spine.  IMPRESSION: 1. No acute findings. 2. Bilateral hip osteoarthritis 3. Lumbar degenerative disc disease.   Electronically Signed   By: Signa Kell M.D.   On: 06/27/2014 18:35   Ct Head Wo Contrast  06/30/2014   CLINICAL DATA:  Altered mental status  EXAM: CT HEAD WITHOUT CONTRAST  TECHNIQUE: Contiguous axial images were obtained from the base of the skull through the vertex without intravenous contrast.  COMPARISON:  06/27/2014  FINDINGS: Prominence of the sulci and ventricles noted compatible with brain atrophy. There is no evidence for acute intracranial hemorrhage, infarct or mass. No abnormal fluid collections identified. The paranasal sinuses appear clear. The mastoid air cells are clear. The skull appears intact.  IMPRESSION: 1. No acute intracranial abnormalities. 2. Brain atrophy.   Electronically Signed   By: Signa Kell M.D.   On: 06/30/2014 13:24   Ct Head Wo Contrast  06/27/2014   CLINICAL DATA:  Larey Seat.  Hit head.  EXAM: CT HEAD WITHOUT CONTRAST  TECHNIQUE: Contiguous axial images were obtained from the base of the skull through the vertex without intravenous contrast.  COMPARISON:  Multiple prior head CTs.  FINDINGS: Examination is degraded by patient motion. There is stable cerebral atrophy, ventriculomegaly and periventricular white  matter disease. No acute intracranial findings or mass lesions. No extra-axial fluid collections.  There is a frontal scalp hematoma but no underlying skull fracture.  IMPRESSION: Chronic changes in the brain but no acute findings.  Frontal scalp hematoma without underlying skull fracture.   Electronically Signed   By: Loralie Champagne M.D.   On: 06/27/2014 18:39   Dg Chest Port 1 View  07/03/2014   CLINICAL DATA:  Shortness of breath.  EXAM: PORTABLE CHEST - 1 VIEW  COMPARISON:  07/01/2014.  FINDINGS: The cardiac silhouette, mediastinal and hilar contours are Stable. Worsening lung aeration is demonstrated with asymmetric edema or more likely bilateral infiltrates.  IMPRESSION: Worsening lung aeration with bilateral asymmetric airspace opacities, likely infiltrates. Asymmetric edema is also possible.   Electronically Signed   By: Loralie Champagne M.D.   On: 07/03/2014 14:25   Dg Chest Port 1 View  07/01/2014   CLINICAL DATA:  Acute respiratory distress.  EXAM: PORTABLE CHEST - 1 VIEW  COMPARISON:  06/30/2014  FINDINGS: Shallow inspiration. Normal heart size and pulmonary vascularity. Developing linear atelectasis or infiltration in the right lung base. Nodule seen previously on the right is not appreciated on today's study.  IMPRESSION: Shallow inspiration with developing linear atelectasis in the right lung base.   Electronically Signed   By: Burman Nieves M.D.   On: 07/01/2014 06:27   Dg Chest Port 1 View  06/30/2014   CLINICAL DATA:  Altered mental status  EXAM: PORTABLE CHEST - 1 VIEW  COMPARISON:  April 18, 2014  FINDINGS: There is a 1.5 x 1.3 cm nodular opacity in the medial aspect of the right upper lobe. Elsewhere lungs are clear. Heart size and pulmonary vascularity are normal. No adenopathy. There is atherosclerotic change in the aorta. No bone lesions.  IMPRESSION: 1.5 x 1.3 cm nodular opacity in the medial aspect of the right upper lobe. Advise noncontrast enhanced chest CT to further assess.   Elsewhere, there is no edema or consolidation. No demonstrable adenopathy.   Electronically Signed   By: Bretta Bang M.D.   On: 06/30/2014 15:10   Dg Shoulder Left Port  07/01/2014   CLINICAL DATA:  Status post fall  EXAM: LEFT SHOULDER - 1 VIEW  COMPARISON:  Portable chest x-ray of today's date  FINDINGS: The bones are adequately mineralized. There is no acute fracture nor dislocation. The [facility] joint and glenohumeral joint exhibit no significant degenerative change. The observed portions of the left clavicle and  upper left ribs are normal.  IMPRESSION: There is no acute bony abnormality demonstrated on this single AP portable view.   Electronically Signed   By: David  [place]   On: 07/01/2014 08:18   Dg Abd Portable 1v  07/05/2014   CLINICAL DATA:  Feeding tube placement.  EXAM: PORTABLE ABDOMEN - 1 VIEW  COMPARISON:  KUB 04/19/2014.  FINDINGS: Double tube noted with tip projected over stomach. Soft tissue structures are unremarkable. Gas pattern nonspecific. No free air. Pelvic phleboliths. Left base atelectasis versus infiltrate.  IMPRESSION: 1. Dobbhoff tube noted with tip projected over stomach. No bowel distention . 2. Left base atelectasis and/or infiltrate.   Electronically Signed   By: Maisie Fus  Register   On: 07/05/2014 13:33   EKG Sinus tachycardia Biatrial enlargement Borderline repolarization abnormality  Echo Left ventricle: The cavity size was normal. Wall thickness was normal. Systolic function was moderately to severely reduced. The estimated ejection fraction was in the range of 30% to 35%. Akinesis of the mid-apicalanteroseptal and apical myocardium.  Microbiology: Recent Results (from the past 240 hour(s))  URINE CULTURE     Status: None   Collection Time    06/30/14  1:52 PM      Result Value Ref Range Status   Specimen Description URINE, CATHETERIZED   Final   Special Requests ADD [id number]   Final   Culture  Setup Time     Final   Value: 06/30/2014 22:49      Performed at [facility]   Colony Count     Final   Value: >=100,000 COLONIES/ML     Performed at [facility]   Culture     Final   Value: ESCHERICHIA COLI     Performed at [facility]   Report Status 07/02/2014 FINAL   Final   Organism ID, Bacteria ESCHERICHIA COLI   Final  CULTURE, BLOOD (ROUTINE X 2)     Status: None   Collection Time    06/30/14  2:54 PM      Result Value Ref Range Status   Specimen Description BLOOD LEFT HAND   Final   Special Requests BOTTLES DRAWN AEROBIC AND ANAEROBIC [facility]   Final   Culture  Setup Time     Final   Value: 06/30/2014 22:21     Performed at [facility]   Culture     Final   Value: NO GROWTH 5 DAYS     Performed at [facility]   Report Status 2014-07-31 FINAL   Final  CULTURE, BLOOD (ROUTINE X 2)     Status: None   Collection Time    06/30/14  4:22 PM      Result Value Ref Range Status   Specimen Description BLOOD LEFT HAND   Final   Special Requests BOTTLES DRAWN AEROBIC AND ANAEROBIC 4 CC   Final   Culture  Setup Time     Final   Value: 06/30/2014 22:21     Performed at [facility]   Culture     Final   Value: NO GROWTH 5 DAYS     Performed at [facility]   Report Status July 31, 2014 FINAL   Final  MRSA PCR SCREENING     Status: None   Collection Time    07/01/14  6:36 AM      Result Value Ref Range Status   MRSA by PCR NEGATIVE  NEGATIVE Final   Comment:  The GeneXpert MRSA Assay (FDA     approved for NASAL specimens     only), is one component of a     comprehensive MRSA colonization     surveillance program. It is not     intended to diagnose MRSA     infection nor to guide or     monitor treatment for     MRSA infections.     Labs: Basic Metabolic Panel:  Recent Labs Lab 07/03/14 0842 07/03/14 2049 07/04/14 0424 07/05/14 0424 06/15/2014 0343  NA 151* 145 144 141 142  K 3.4* 3.6* 3.4* 3.1* 3.7  CL 114* 110 110 106 107  CO2 19 17* GLUCOSE 79 95 82 91 106*  BUN CREATININE 0.56 0.60 0.60 0.57 0.66  CALCIUM 8.8 9.1 8.7 8.4 9.2  MG  --   --  1.9  --   --    Liver Function Tests:  Recent Labs Lab 07/02/14 2152  AST 25  ALT 18  ALKPHOS 65  BILITOT 0.3  PROT 6.3  ALBUMIN 2.4*   No results found for this basename: LIPASE, AMYLASE,  in the last 168 hours No results found for this basename: AMMONIA,  in the last 168 hours CBC:  Recent Labs Lab 07/02/14 0353 07/03/14 0245 07/04/14 0424 07/05/14 0424  WBC 9.1 9.3 7.1 7.3  NEUTROABS  --  7.3  --   --   HGB 11.4* 11.9* 11.7* 12.6  HCT 34.0* 36.0 35.8* 38.1  MCV 88.5 90.0 89.7 89.2  PLT 157 173 177 195   Cardiac Enzymes: No results found for this basename: CKTOTAL, CKMB, CKMBINDEX, TROPONINI,  in the last 168 hours BNP: BNP (last 3 results) No results found for this basename: PROBNP,  in the last 8760 hours CBG:  Recent Labs Lab 06/29/2014 0351 06/15/2014 0752 06/27/2014 1157 07/01/2014 2206 07/07/14 1159  GLUCAP 105* 140* 112* 107* 93       Signed:  Iram Astorino L  [facility] 07/08/2014, 8:31 AM

## 2014-07-08 NOTE — Clinical Social Work Note (Signed)
Patient to discharge back to [facility]: Memory Care Unit on 07/08/14 Report #:[id number] Transportation by [facility]- called at 10:45 Family informed: Nadyia Hanneken, son,  [place], [phone]  Merlyn Lot, [facility] Clinical Social Worker [phone]

## 2014-07-08 NOTE — Progress Notes (Signed)
Patient being transported back to [facility] via Ambulance.  Daughter and caregiver at bedside and they requested for pt to have a dose of Motrin before she leaves.  Motrin given via Panda tube and then Panda tube was D/C'd as ordered by Dr Lendell Caprice.  Report given to Lupita Leash at [facility] and d/c packet sent with [facility].  Pt ready for discharge via stretcher with [facility]

## 2014-07-09 DEATH — deceased

## 2014-08-08 DEATH — deceased

## 2015-09-05 IMAGING — CT CT HEAD W/O CM
1 of 2 series · 16 of 30 positions shown, 20 images · non-contrast
Comparison: Multiple prior head CTs.

CLINICAL DATA: Fell.  Hit head.

EXAM:
CT HEAD WITHOUT CONTRAST
TECHNIQUE: Contiguous axial images were obtained from the base of the skull
through the vertex without intravenous contrast.

[Series 2: head 4.8 h37s · axial · 0.43mm/px · z∈[-167,-34]mm · 16 of 32 slices shown, 20 images]
[im 2/32  brain]
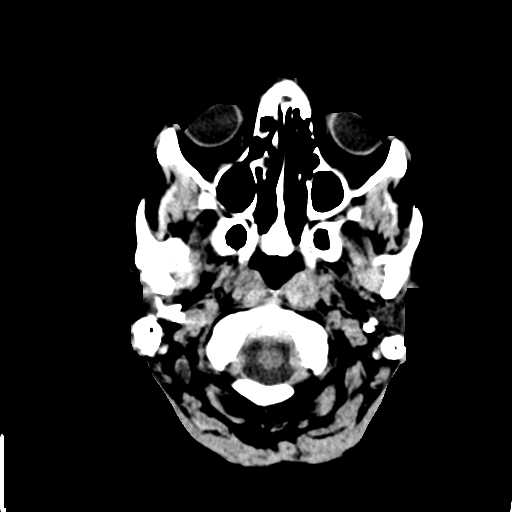
[im 2/32  bone]
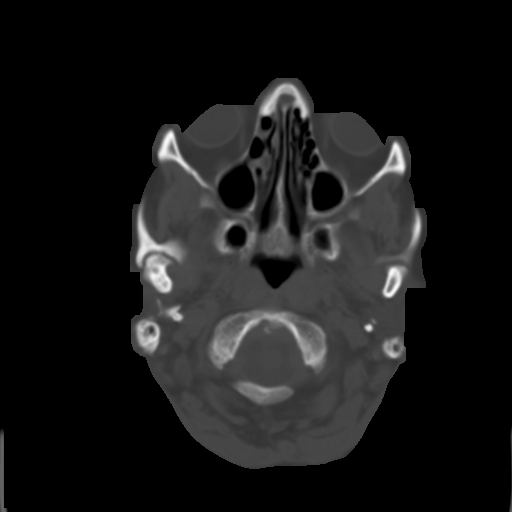
[im 3/32  brain]
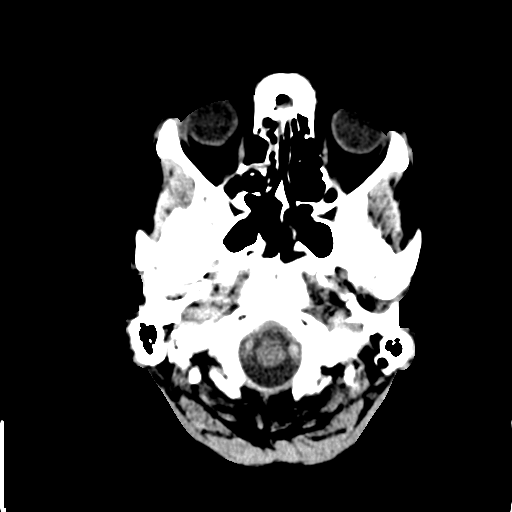
[im 6/32  brain]
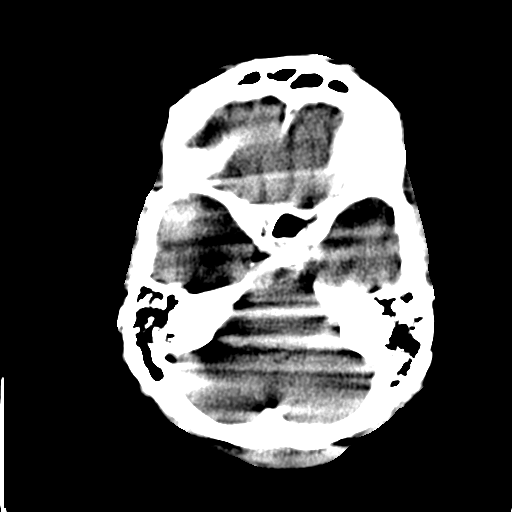
[im 8/32  brain]
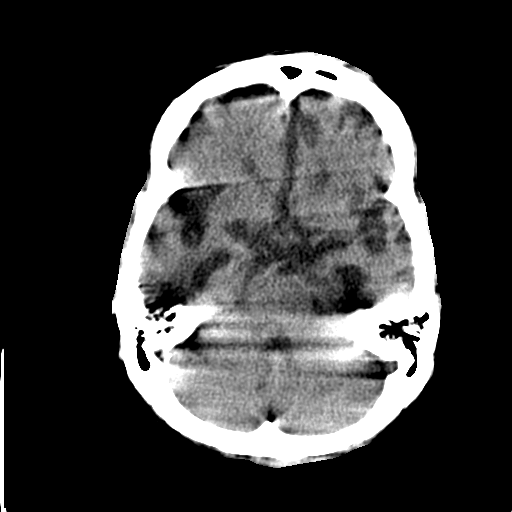
[im 9/32  brain]
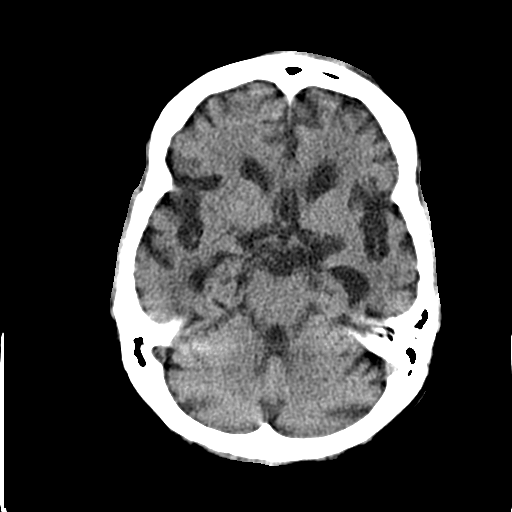
[im 9/32  bone]
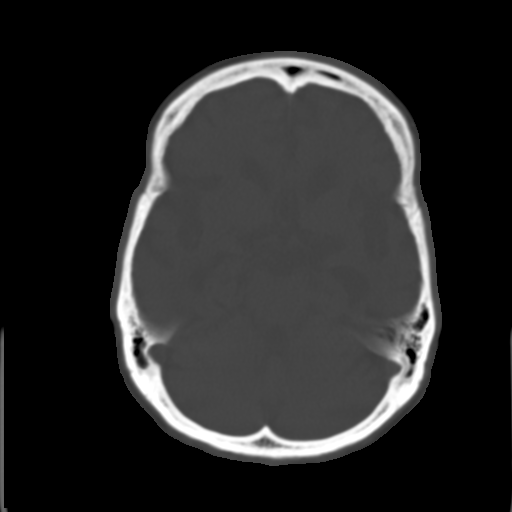
[im 11/32  brain]
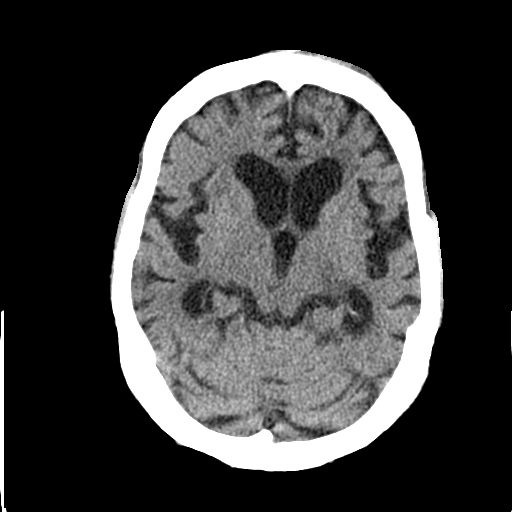
[im 14/32  brain]
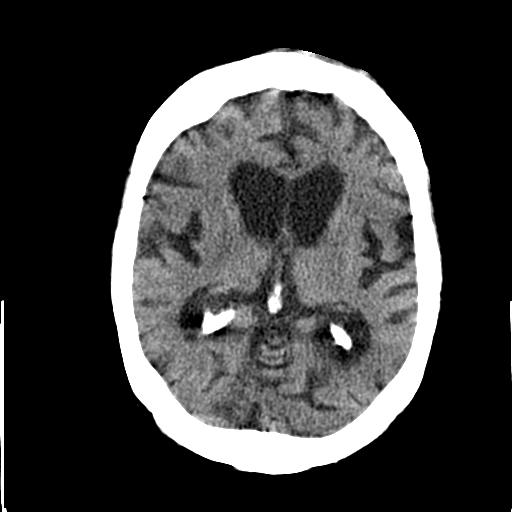
[im 15/32  brain]
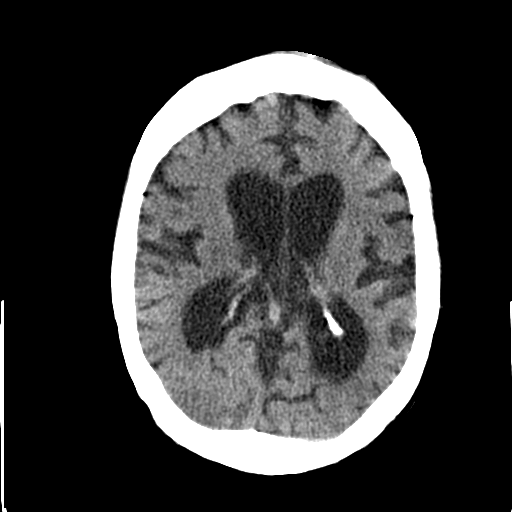
[im 17/32  brain]
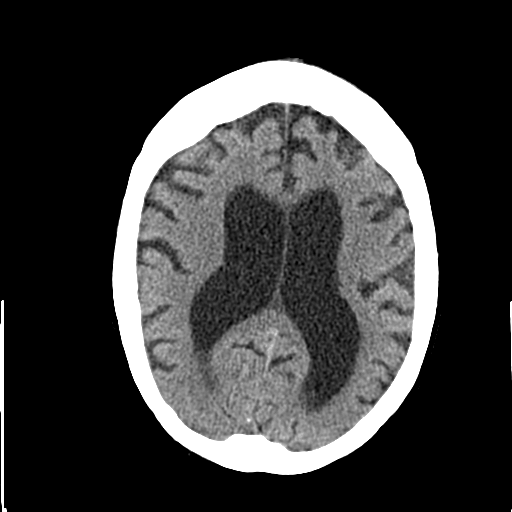
[im 17/32  bone]
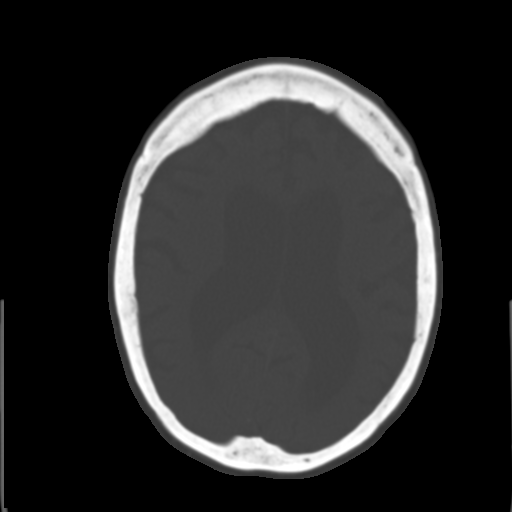
[im 18/32  brain]
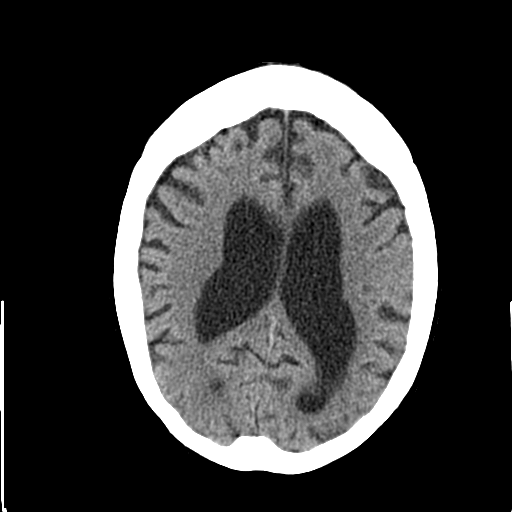
[im 21/32  brain]
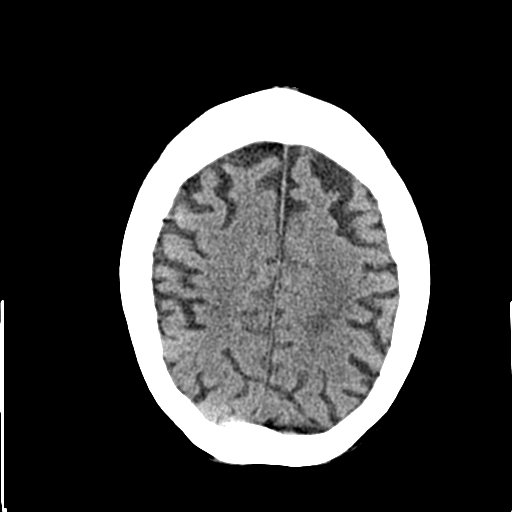
[im 23/32  brain]
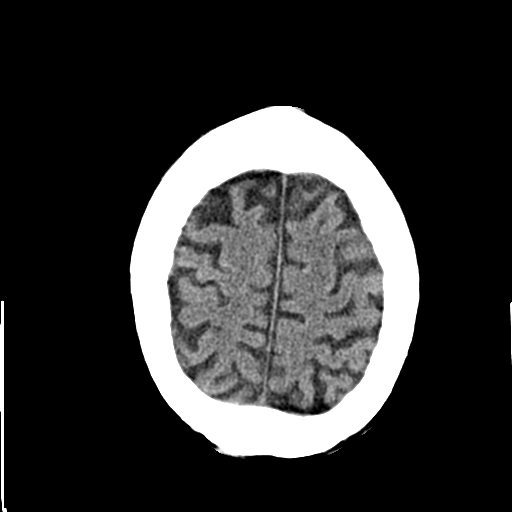
[im 24/32  brain]
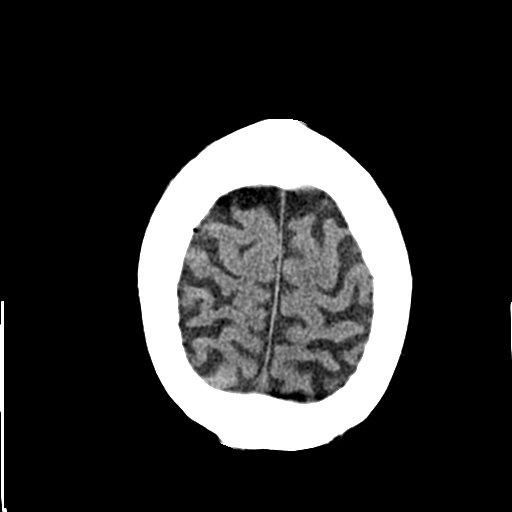
[im 24/32  bone]
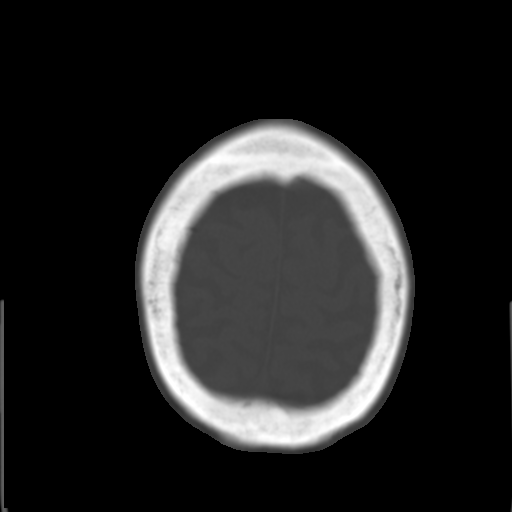
[im 26/32  brain]
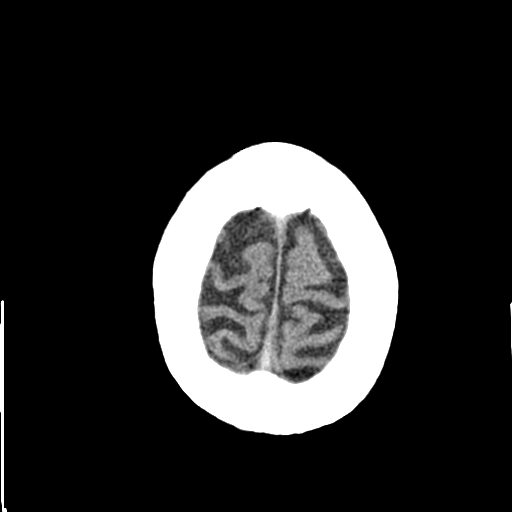
[im 29/32  brain]
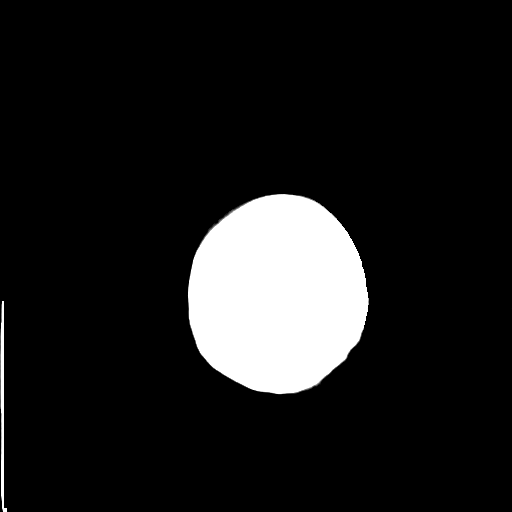
[im 30/32  brain]
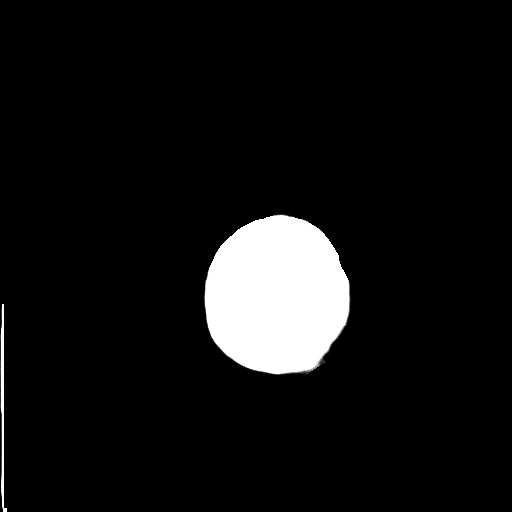

[16 of 30 positions shown; findings below may reference images not displayed]

FINDINGS: Examination is degraded by patient motion. There is stable cerebral
atrophy, ventriculomegaly and periventricular white matter disease.
No acute intracranial findings or mass lesions. No extra-axial fluid
collections.

There is a frontal scalp hematoma but no underlying skull fracture.
IMPRESSION: Chronic changes in the brain but no acute findings.

Frontal scalp hematoma without underlying skull fracture.

## 2015-09-08 IMAGING — CR DG CHEST 1V PORT
1 series · 1 of 1 positions shown · non-contrast
Comparison: April 18, 2014

CLINICAL DATA: Altered mental status

EXAM:
PORTABLE CHEST - 1 VIEW

[AP]
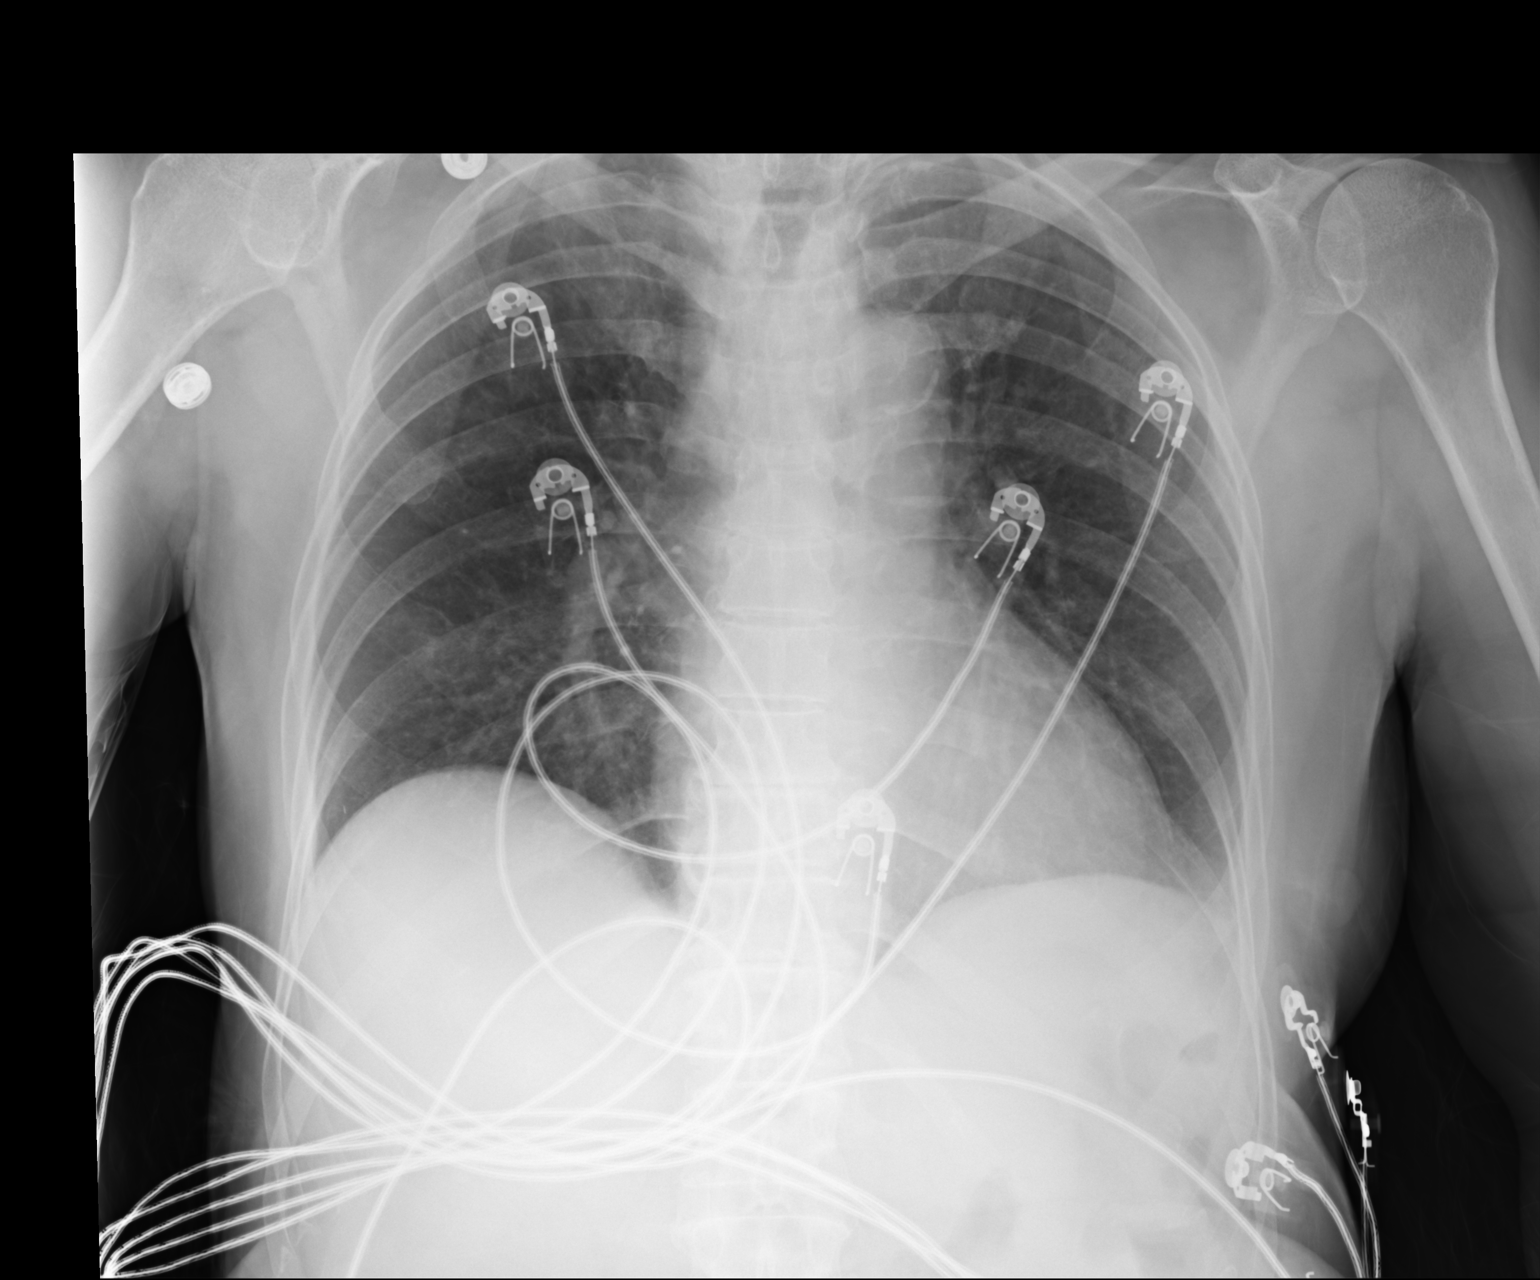

[1 of 1 positions shown; findings below may reference images not displayed]

FINDINGS: There is a 1.5 x 1.3 cm nodular opacity in the medial aspect of the
right upper lobe. Elsewhere lungs are clear. Heart size and
pulmonary vascularity are normal. No adenopathy. There is
atherosclerotic change in the aorta. No bone lesions.
IMPRESSION: 1.5 x 1.3 cm nodular opacity in the medial aspect of the right upper
lobe. Advise noncontrast enhanced chest CT to further assess.

Elsewhere, there is no edema or consolidation. No demonstrable
adenopathy.

## 2015-09-09 IMAGING — CR DG SHOULDER 1V*L*
1 series · 1 of 1 positions shown · non-contrast
Comparison: Portable chest x-ray of today's date

CLINICAL DATA: Status post fall

EXAM:
LEFT SHOULDER - 1 VIEW

[AP]
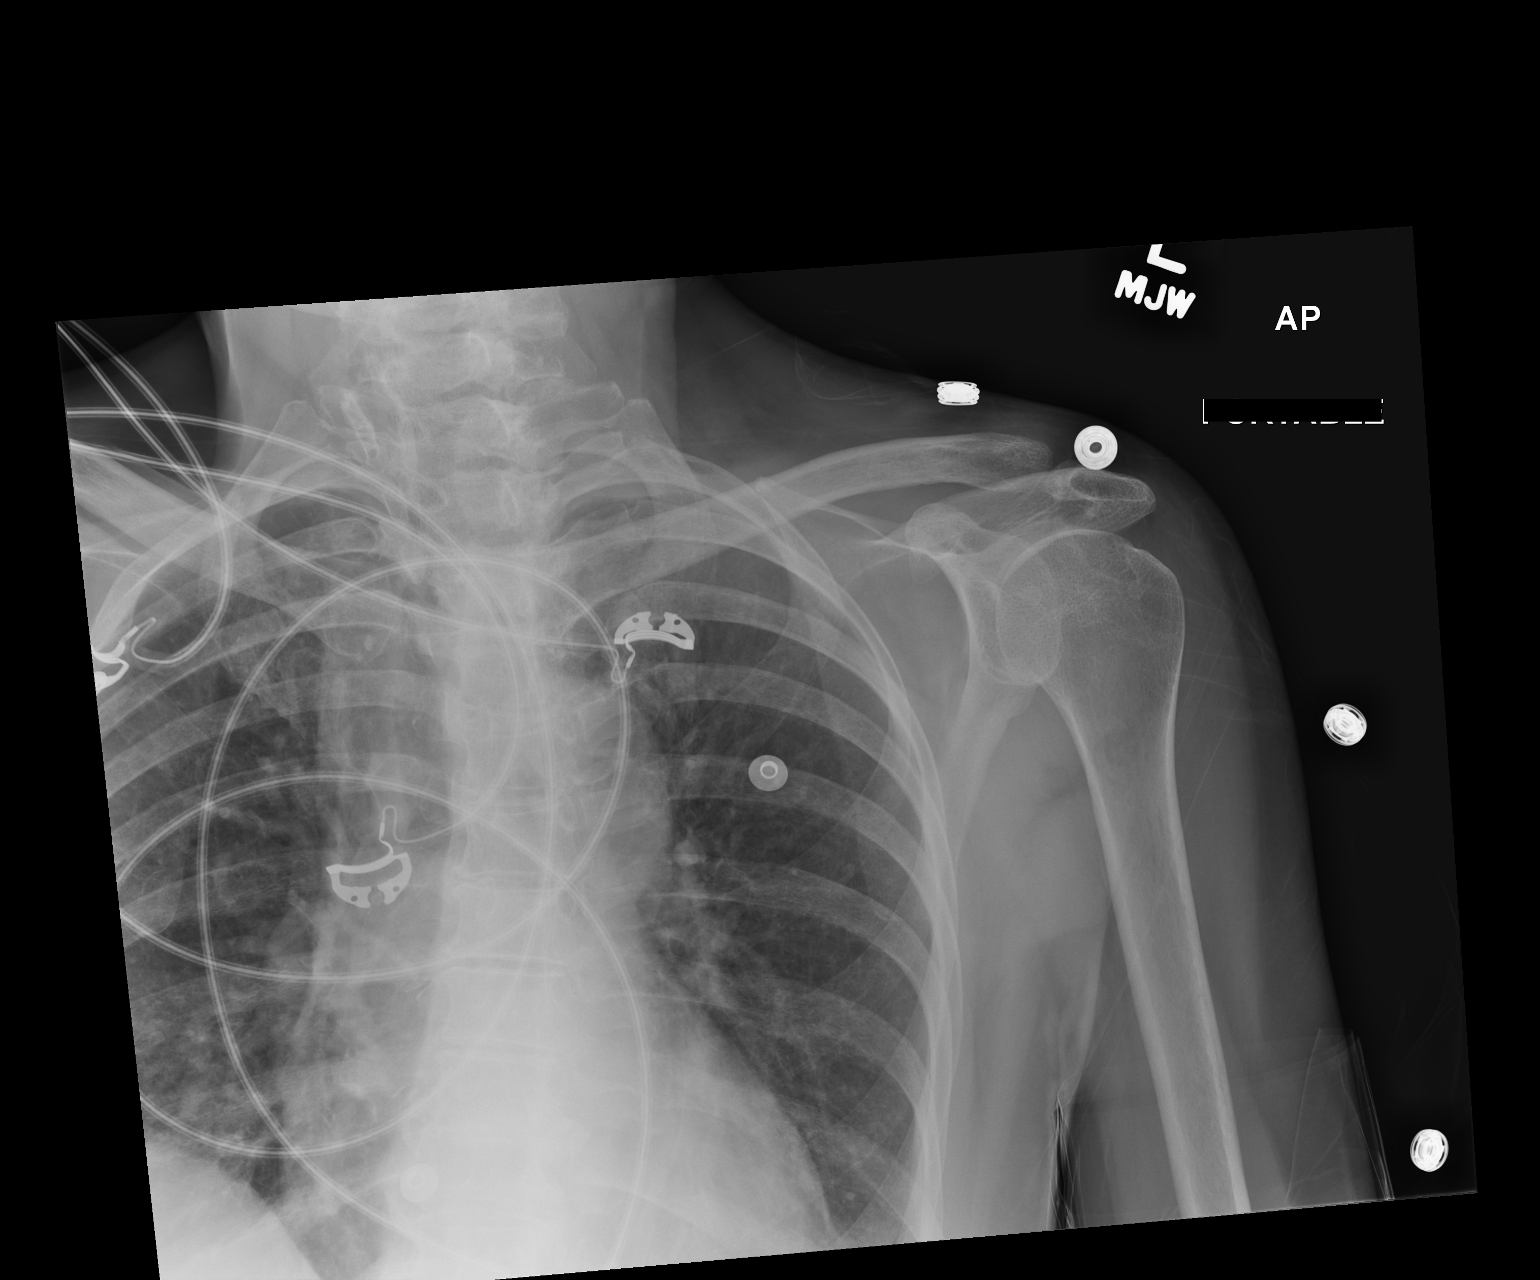

[1 of 1 positions shown; findings below may reference images not displayed]

FINDINGS: The bones are adequately mineralized. There is no acute fracture nor
dislocation. The AC joint and glenohumeral joint exhibit no
significant degenerative change. The observed portions of the left
clavicle and upper left ribs are normal.
IMPRESSION: There is no acute bony abnormality demonstrated on this single AP
portable view.

## 2015-09-09 IMAGING — CR DG CHEST 1V PORT
1 series · 1 of 1 positions shown · non-contrast
Comparison: 06/30/2014

CLINICAL DATA: Acute respiratory distress.

EXAM:
PORTABLE CHEST - 1 VIEW

[portable]
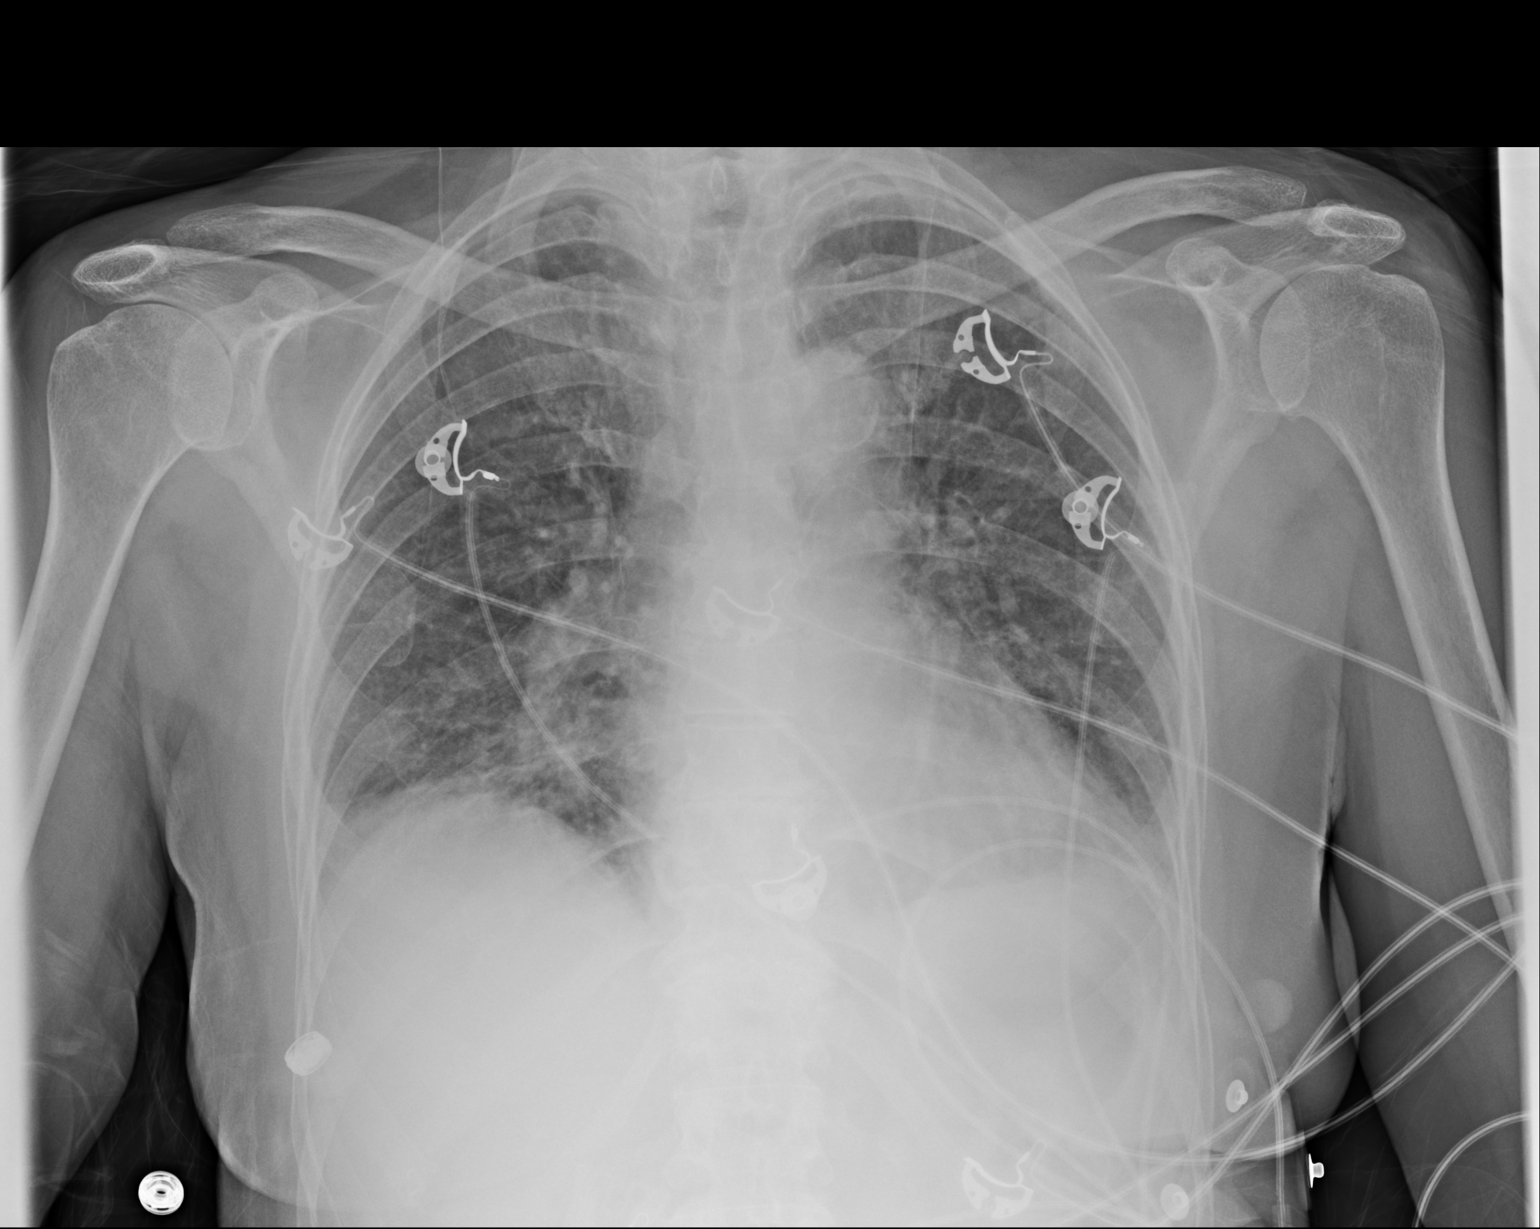

[1 of 1 positions shown; findings below may reference images not displayed]

FINDINGS: Shallow inspiration. Normal heart size and pulmonary vascularity.
Developing linear atelectasis or infiltration in the right lung
base. Nodule seen previously on the right is not appreciated on
today's study.
IMPRESSION: Shallow inspiration with developing linear atelectasis in the right
lung base.
